# Patient Record
Sex: Female | Born: 1950
Health system: Southern US, Community
[De-identification: ages and names within clinical notes are randomized; demographics above are authoritative.]

## PROBLEM LIST (undated history)

## (undated) DIAGNOSIS — M1990 Unspecified osteoarthritis, unspecified site: Secondary | ICD-10-CM

## (undated) DIAGNOSIS — E785 Hyperlipidemia, unspecified: Secondary | ICD-10-CM

## (undated) DIAGNOSIS — F419 Anxiety disorder, unspecified: Secondary | ICD-10-CM

## (undated) DIAGNOSIS — J189 Pneumonia, unspecified organism: Secondary | ICD-10-CM

## (undated) DIAGNOSIS — N189 Chronic kidney disease, unspecified: Secondary | ICD-10-CM

## (undated) DIAGNOSIS — K7689 Other specified diseases of liver: Secondary | ICD-10-CM

## (undated) DIAGNOSIS — K635 Polyp of colon: Secondary | ICD-10-CM

## (undated) DIAGNOSIS — K5790 Diverticulosis of intestine, part unspecified, without perforation or abscess without bleeding: Secondary | ICD-10-CM

## (undated) DIAGNOSIS — T7840XA Allergy, unspecified, initial encounter: Secondary | ICD-10-CM

## (undated) DIAGNOSIS — I10 Essential (primary) hypertension: Secondary | ICD-10-CM

## (undated) HISTORY — PX: COLONOSCOPY: SHX174

## (undated) HISTORY — DX: Unspecified osteoarthritis, unspecified site: M19.90

## (undated) HISTORY — DX: Other specified diseases of liver: K76.89

## (undated) HISTORY — DX: Anxiety disorder, unspecified: F41.9

## (undated) HISTORY — PX: US ABDOMEN AND PELVIS  (ARMX HX): HXRAD1663

## (undated) HISTORY — PX: BUNIONECTOMY: SHX129

## (undated) HISTORY — DX: Diverticulosis of intestine, part unspecified, without perforation or abscess without bleeding: K57.90

## (undated) HISTORY — PX: HEMORROIDECTOMY: SUR656

## (undated) HISTORY — DX: Polyp of colon: K63.5

## (undated) HISTORY — DX: Hyperlipidemia, unspecified: E78.5

## (undated) HISTORY — DX: Allergy, unspecified, initial encounter: T78.40XA

## (undated) HISTORY — PX: POLYPECTOMY: SHX149

## (undated) HISTORY — DX: Chronic kidney disease, unspecified: N18.9

## (undated) HISTORY — DX: Pneumonia, unspecified organism: J18.9

---

## 2010-10-11 HISTORY — PX: UPPER GASTROINTESTINAL ENDOSCOPY: SHX188

## 2013-10-26 ENCOUNTER — Emergency Department (HOSPITAL_COMMUNITY)
Admission: EM | Admit: 2013-10-26 | Discharge: 2013-10-26 | Disposition: A | Discharge status: Home / Self Care | Payer: BC Managed Care – PPO | Source: Home / Self Care | Attending: Family Medicine | Admitting: Family Medicine

## 2013-10-26 ENCOUNTER — Encounter (HOSPITAL_COMMUNITY): Payer: Self-pay | Admitting: Emergency Medicine

## 2013-10-26 DIAGNOSIS — J069 Acute upper respiratory infection, unspecified: Secondary | ICD-10-CM

## 2013-10-26 DIAGNOSIS — R030 Elevated blood-pressure reading, without diagnosis of hypertension: Secondary | ICD-10-CM

## 2013-10-26 DIAGNOSIS — IMO0001 Reserved for inherently not codable concepts without codable children: Secondary | ICD-10-CM

## 2013-10-26 LAB — POCT RAPID STREP A: Streptococcus, Group A Screen (Direct): NEGATIVE

## 2013-10-26 MED ORDER — IPRATROPIUM BROMIDE 0.06 % NA SOLN: 2.0000 | Freq: Four times a day (QID) | NASAL | Status: DC

## 2013-10-26 NOTE — ED Notes (Signed)
C/o   Chest congestion.  Sinus pressure and pain.  Scratchy throat.  No relief with otc meds.  Symptoms present since Saturday.  Denies fever, n/v/d

## 2013-10-26 NOTE — ED Provider Notes (Signed)
Medical screening examination/treatment/procedure(s) were performed by resident physician or non-physician practitioner and as supervising physician I was immediately available for consultation/collaboration.   Pauline Good MD.   Billy Fischer, MD 10/26/13 [phone]

## 2013-10-26 NOTE — Discharge Instructions (Signed)
Rapid strep screen was negative.   Antibiotic Nonuse  Your caregiver felt that the infection or problem was not one that would be helped with an antibiotic. Infections may be caused by viruses or bacteria. Only a caregiver can tell which one of these is the likely cause of an illness. A cold is the most common cause of infection in both adults and children. A cold is a virus. Antibiotic treatment will have no effect on a viral infection. Viruses can lead to many lost days of work caring for sick children and many missed days of school. Children may catch as many as 10 "colds" or "flus" per year during which they can be tearful, cranky, and uncomfortable. The goal of treating a virus is aimed at keeping the ill person comfortable. Antibiotics are medications used to help the body fight bacterial infections. There are relatively few types of bacteria that cause infections but there are hundreds of viruses. While both viruses and bacteria cause infection they are very different types of germs. A viral infection will typically go away by itself within 7 to 10 days. Bacterial infections may spread or get worse without antibiotic treatment. Examples of bacterial infections are:  Sore throats (like strep throat or tonsillitis).  Infection in the lung (pneumonia).  Ear and skin infections. Examples of viral infections are:  Colds or flus.  Most coughs and bronchitis.  Sore throats not caused by Strep.  Runny noses. It is often best not to take an antibiotic when a viral infection is the cause of the problem. Antibiotics can kill off the helpful bacteria that we have inside our body and allow harmful bacteria to start growing. Antibiotics can cause side effects such as allergies, nausea, and diarrhea without helping to improve the symptoms of the viral infection. Additionally, repeated uses of antibiotics can cause bacteria inside of our body to become resistant. That resistance can be passed onto  harmful bacterial. The next time you have an infection it may be harder to treat if antibiotics are used when they are not needed. Not treating with antibiotics allows our own immune system to develop and take care of infections more efficiently. Also, antibiotics will work better for [place] when they are prescribed for bacterial infections. Treatments for a child that is ill may include:  Give extra fluids throughout the day to stay hydrated.  Get plenty of rest.  Only give your child over-the-counter or prescription medicines for pain, discomfort, or fever as directed by your caregiver.  The use of a cool mist humidifier may help stuffy noses.  Cold medications if suggested by your caregiver. Your caregiver may decide to start you on an antibiotic if:  The problem you were seen for today continues for a longer length of time than expected.  You develop a secondary bacterial infection. SEEK MEDICAL CARE IF:  Fever lasts longer than 5 days.  Symptoms continue to get worse after 5 to 7 days or become severe.  Difficulty in breathing develops.  Signs of dehydration develop (poor drinking, rare urinating, dark colored urine).  Changes in behavior or worsening tiredness (listlessness or lethargy). Document Released: 07/22/2001 Document Revised: 08/05/2011 Document Reviewed: 01/18/2009 [facility] Patient Information 2014 [place], [place].  Hypertension As your heart beats, it forces blood through your arteries. This force is your blood pressure. If the pressure is too high, it is called hypertension (HTN) or high blood pressure. HTN is dangerous because you may have it and not know it. High blood pressure may  mean that your heart has to work harder to pump blood. Your arteries may be narrow or stiff. The extra work puts you at risk for heart disease, stroke, and other problems.  Blood pressure consists of two numbers, a higher number over a lower, 110/72, for example. It is stated as "110 over  72." The ideal is below 120 for the top number (systolic) and under 80 for the bottom (diastolic). Write down your blood pressure today. You should pay close attention to your blood pressure if you have certain conditions such as:  Heart failure.  Prior heart attack.  Diabetes  Chronic kidney disease.  Prior stroke.  Multiple risk factors for heart disease. To see if you have HTN, your blood pressure should be measured while you are seated with your arm held at the level of the heart. It should be measured at least twice. A one-time elevated blood pressure reading (especially in the Emergency Department) does not mean that you need treatment. There may be conditions in which the blood pressure is different between your right and left arms. It is important to see your caregiver soon for a recheck. Most people have essential hypertension which means that there is not a specific cause. This type of high blood pressure may be lowered by changing lifestyle factors such as:  Stress.  Smoking.  Lack of exercise.  Excessive weight.  Drug/tobacco/alcohol use.  Eating less salt. Most people do not have symptoms from high blood pressure until it has caused damage to the body. Effective treatment can often prevent, delay or reduce that damage. TREATMENT  When a cause has been identified, treatment for high blood pressure is directed at the cause. There are a large number of medications to treat HTN. These fall into several categories, and your caregiver will help you select the medicines that are best for you. Medications may have side effects. You should review side effects with your caregiver. If your blood pressure stays high after you have made lifestyle changes or started on medicines,   Your medication(s) may need to be changed.  Other problems may need to be addressed.  Be certain you understand your prescriptions, and know how and when to take your medicine.  Be sure to follow up  with your caregiver within the time frame advised (usually within two weeks) to have your blood pressure rechecked and to review your medications.  If you are taking more than one medicine to lower your blood pressure, make sure you know how and at what times they should be taken. Taking two medicines at the same time can result in blood pressure that is too low. SEEK IMMEDIATE MEDICAL CARE IF:  You develop a severe headache, blurred or changing vision, or confusion.  You have unusual weakness or numbness, or a faint feeling.  You have severe chest or abdominal pain, vomiting, or breathing problems. MAKE SURE YOU:   Understand these instructions.  Will watch your condition.  Will get help right away if you are not doing well or get worse. Document Released: 05/13/2005 Document Revised: 08/05/2011 Document Reviewed: 01/01/2008 [facility] Patient Information 2014 [place].  Upper Respiratory Infection, Adult An upper respiratory infection (URI) is also sometimes known as the common cold. The upper respiratory tract includes the nose, sinuses, throat, trachea, and bronchi. Bronchi are the airways leading to the lungs. Most people improve within 1 week, but symptoms can last up to 2 weeks. A residual cough may last even longer.  CAUSES Many different  viruses can infect the tissues lining the upper respiratory tract. The tissues become irritated and inflamed and often become very moist. Mucus production is also common. A cold is contagious. You can easily spread the virus to others by oral contact. This includes kissing, sharing a glass, coughing, or sneezing. Touching your mouth or nose and then touching a surface, which is then touched by another person, can also spread the virus. SYMPTOMS  Symptoms typically develop 1 to 3 days after you come in contact with a cold virus. Symptoms vary from person to person. They may include:  Runny nose.  Sneezing.  Nasal congestion.  Sinus  irritation.  Sore throat.  Loss of voice (laryngitis).  Cough.  Fatigue.  Muscle aches.  Loss of appetite.  Headache.  Low-grade fever. DIAGNOSIS  You might diagnose your own cold based on familiar symptoms, since most people get a cold 2 to 3 times a year. Your caregiver can confirm this based on your exam. Most importantly, your caregiver can check that your symptoms are not due to another disease such as strep throat, sinusitis, pneumonia, asthma, or epiglottitis. Blood tests, throat tests, and X-rays are not necessary to diagnose a common cold, but they may sometimes be helpful in excluding other more serious diseases. Your caregiver will decide if any further tests are required. RISKS AND COMPLICATIONS  You may be at risk for a more severe case of the common cold if you smoke cigarettes, have chronic heart disease (such as heart failure) or lung disease (such as asthma), or if you have a weakened immune system. The very young and very old are also at risk for more serious infections. Bacterial sinusitis, middle ear infections, and bacterial pneumonia can complicate the common cold. The common cold can worsen asthma and chronic obstructive pulmonary disease (COPD). Sometimes, these complications can require emergency medical care and may be life-threatening. PREVENTION  The best way to protect against getting a cold is to practice good hygiene. Avoid oral or hand contact with people with cold symptoms. Wash your hands often if contact occurs. There is no clear evidence that vitamin C, vitamin E, echinacea, or exercise reduces the chance of developing a cold. However, it is always recommended to get plenty of rest and practice good nutrition. TREATMENT  Treatment is directed at relieving symptoms. There is no cure. Antibiotics are not effective, because the infection is caused by a virus, not by bacteria. Treatment may include:  Increased fluid intake. Sports drinks offer valuable  electrolytes, sugars, and fluids.  Breathing heated mist or steam (vaporizer or shower).  Eating chicken soup or other clear broths, and maintaining good nutrition.  Getting plenty of rest.  Using gargles or lozenges for comfort.  Controlling fevers with ibuprofen or acetaminophen as directed by your caregiver.  Increasing usage of your inhaler if you have asthma. Zinc gel and zinc lozenges, taken in the first 24 hours of the common cold, can shorten the duration and lessen the severity of symptoms. Pain medicines may help with fever, muscle aches, and throat pain. A variety of non-prescription medicines are available to treat congestion and runny nose. Your caregiver can make recommendations and may suggest nasal or lung inhalers for other symptoms.  HOME CARE INSTRUCTIONS   Only take over-the-counter or prescription medicines for pain, discomfort, or fever as directed by your caregiver.  Use a warm mist humidifier or inhale steam from a shower to increase air moisture. This may keep secretions moist and make it easier to  breathe.  Drink enough water and fluids to keep your urine clear or pale yellow.  Rest as needed.  Return to work when your temperature has returned to normal or as your caregiver advises. You may need to stay home longer to avoid infecting others. You can also use a face mask and careful hand washing to prevent spread of the virus. SEEK MEDICAL CARE IF:   After the first few days, you feel you are getting worse rather than better.  You need your caregiver's advice about medicines to control symptoms.  You develop chills, worsening shortness of breath, or brown or red sputum. These may be signs of pneumonia.  You develop yellow or brown nasal discharge or pain in the face, especially when you bend forward. These may be signs of sinusitis.  You develop a fever, swollen neck glands, pain with swallowing, or white areas in the back of your throat. These may be signs  of strep throat. SEEK IMMEDIATE MEDICAL CARE IF:   You have a fever.  You develop severe or persistent headache, ear pain, sinus pain, or chest pain.  You develop wheezing, a prolonged cough, cough up blood, or have a change in your usual mucus (if you have chronic lung disease).  You develop sore muscles or a stiff neck. Document Released: 11/06/2000 Document Revised: 08/05/2011 Document Reviewed: 09/14/2010 [facility] Patient Information 2014 [place], [place].

## 2013-10-26 NOTE — ED Provider Notes (Signed)
CSN: [id number]     Arrival date & time 10/26/13  1141 History   First MD Initiated Contact with Patient 10/26/13 1331     Chief Complaint  Patient presents with  . URI   (Consider location/radiation/quality/duration/timing/severity/associated sxs/prior Treatment) HPI Comments: Non-smoker Retired PCP: none  Patient is a 63 y.o. female presenting with URI. The history is provided by the patient.  URI Presenting symptoms: congestion, cough, rhinorrhea and sore throat   Presenting symptoms: no fatigue and no fever   Severity:  Mild Onset quality:  Gradual Duration:  3 days Timing:  Constant Progression:  Unchanged Chronicity:  New Associated symptoms: no arthralgias, no headaches, no myalgias, no neck pain, no sinus pain, no sneezing, no swollen glands and no wheezing   Risk factors: sick contacts   Risk factors comment:  +husband ill with same   History reviewed. No pertinent past medical history. History reviewed. No pertinent past surgical history. History reviewed. No pertinent family history. History  Substance Use Topics  . Smoking status: Never Smoker   . Smokeless tobacco: Not on file  . Alcohol Use: Yes   OB History   Grav Para Term Preterm Abortions TAB SAB Ect Mult Living                 Review of Systems  Constitutional: Negative for fever, chills and fatigue.  HENT: Positive for congestion, rhinorrhea, sinus pressure and sore throat. Negative for nosebleeds, postnasal drip and sneezing.   Eyes: Negative.   Respiratory: Positive for cough. Negative for chest tightness and wheezing.   Cardiovascular: Negative.   Genitourinary: Negative.   Musculoskeletal: Negative for arthralgias, myalgias and neck pain.  Skin: Negative.   Neurological: Negative for dizziness, weakness, light-headedness and headaches.    Allergies  Review of patient's allergies indicates no known allergies.  Home Medications   Prior to Admission medications   Medication Sig Start Date  End Date Taking? Authorizing Provider  simvastatin (ZOCOR) 20 MG tablet Take 20 mg by mouth daily.   Yes Historical Provider, MD  ipratropium (ATROVENT) 0.06 % nasal spray Place 2 sprays into both nostrils 4 (four) times daily. 10/26/13   Annett Gula Mckinlee Dunk, PA   BP 159/89  Pulse 75  Temp(Src) 98.3 F (36.8 C) (Oral)  Resp 20  SpO2 97% Physical Exam  Nursing note and vitals reviewed. Constitutional: She is oriented to person, place, and time. She appears well-developed and well-nourished. No distress.  HENT:  Head: Normocephalic and atraumatic.  Right Ear: Hearing, tympanic membrane, external ear and ear canal normal.  Left Ear: Hearing, tympanic membrane, external ear and ear canal normal.  Nose: Nose normal.  Mouth/Throat: Uvula is midline, oropharynx is clear and moist and mucous membranes are normal. No oral lesions. No trismus in the jaw.  Eyes: Conjunctivae are normal. Right eye exhibits no discharge. Left eye exhibits no discharge. No scleral icterus.  Neck: Normal range of motion. Neck supple.  Cardiovascular: Normal rate, regular rhythm and normal heart sounds.   Pulmonary/Chest: Effort normal and breath sounds normal. No respiratory distress. She has no wheezes.  Musculoskeletal: Normal range of motion.  Lymphadenopathy:    She has no cervical adenopathy.  Neurological: She is alert and oriented to person, place, and time.  Skin: Skin is warm and dry. No rash noted. No erythema.  Psychiatric: She has a normal mood and affect. Her behavior is normal.    ED Course  Procedures (including critical care time) Labs Review Labs Reviewed  POCT RAPID  STREP A ([facility]RG CARE ONLY)    Imaging Review No results found.   MDM   1. URI (upper respiratory infection)   2. Elevated blood pressure    Rapid strep negative. Advised patient regarding symptomatic care at home and that illness if likely a viral URI and antibiotics not clinically indicated. Will provide Rx for Atrovent  nasal spray and advise patient locate a primary care provider to follow up with for elevated BP. Discourage use of oral decongestants given BP elevation.    [place], [place] 10/26/13 1438

## 2014-01-19 DIAGNOSIS — Z1231 Encounter for screening mammogram for malignant neoplasm of breast: Secondary | ICD-10-CM | POA: Diagnosis not present

## 2014-01-19 DIAGNOSIS — Z1289 Encounter for screening for malignant neoplasm of other sites: Secondary | ICD-10-CM | POA: Diagnosis not present

## 2014-01-24 DIAGNOSIS — Z87891 Personal history of nicotine dependence: Secondary | ICD-10-CM | POA: Diagnosis not present

## 2014-01-24 DIAGNOSIS — Z6828 Body mass index (BMI) 28.0-28.9, adult: Secondary | ICD-10-CM | POA: Diagnosis not present

## 2014-01-24 DIAGNOSIS — Z1331 Encounter for screening for depression: Secondary | ICD-10-CM | POA: Diagnosis not present

## 2014-01-24 DIAGNOSIS — E785 Hyperlipidemia, unspecified: Secondary | ICD-10-CM | POA: Diagnosis not present

## 2014-01-24 DIAGNOSIS — J189 Pneumonia, unspecified organism: Secondary | ICD-10-CM | POA: Diagnosis not present

## 2014-01-24 DIAGNOSIS — K5900 Constipation, unspecified: Secondary | ICD-10-CM | POA: Diagnosis not present

## 2014-03-01 DIAGNOSIS — Z79899 Other long term (current) drug therapy: Secondary | ICD-10-CM | POA: Diagnosis not present

## 2014-03-01 DIAGNOSIS — E785 Hyperlipidemia, unspecified: Secondary | ICD-10-CM | POA: Diagnosis not present

## 2014-03-07 DIAGNOSIS — R1013 Epigastric pain: Secondary | ICD-10-CM | POA: Diagnosis not present

## 2014-03-07 DIAGNOSIS — Z87891 Personal history of nicotine dependence: Secondary | ICD-10-CM | POA: Diagnosis not present

## 2014-03-07 DIAGNOSIS — Z6829 Body mass index (BMI) 29.0-29.9, adult: Secondary | ICD-10-CM | POA: Diagnosis not present

## 2014-03-07 DIAGNOSIS — Z0000 Encounter for general adult medical examination without abnormal findings: Secondary | ICD-10-CM | POA: Diagnosis not present

## 2014-03-07 DIAGNOSIS — Z23 Encounter for immunization: Secondary | ICD-10-CM | POA: Diagnosis not present

## 2014-03-07 DIAGNOSIS — E785 Hyperlipidemia, unspecified: Secondary | ICD-10-CM | POA: Diagnosis not present

## 2014-03-07 DIAGNOSIS — R7309 Other abnormal glucose: Secondary | ICD-10-CM | POA: Diagnosis not present

## 2014-03-08 DIAGNOSIS — Z1212 Encounter for screening for malignant neoplasm of rectum: Secondary | ICD-10-CM | POA: Diagnosis not present

## 2014-03-25 DIAGNOSIS — H5203 Hypermetropia, bilateral: Secondary | ICD-10-CM | POA: Diagnosis not present

## 2015-02-28 DIAGNOSIS — E785 Hyperlipidemia, unspecified: Secondary | ICD-10-CM | POA: Diagnosis not present

## 2015-02-28 DIAGNOSIS — R7309 Other abnormal glucose: Secondary | ICD-10-CM | POA: Diagnosis not present

## 2015-03-10 DIAGNOSIS — L659 Nonscarring hair loss, unspecified: Secondary | ICD-10-CM | POA: Diagnosis not present

## 2015-03-10 DIAGNOSIS — R7309 Other abnormal glucose: Secondary | ICD-10-CM | POA: Diagnosis not present

## 2015-03-10 DIAGNOSIS — Z6829 Body mass index (BMI) 29.0-29.9, adult: Secondary | ICD-10-CM | POA: Diagnosis not present

## 2015-03-10 DIAGNOSIS — R1013 Epigastric pain: Secondary | ICD-10-CM | POA: Diagnosis not present

## 2015-03-10 DIAGNOSIS — Z0000 Encounter for general adult medical examination without abnormal findings: Secondary | ICD-10-CM | POA: Diagnosis not present

## 2015-03-10 DIAGNOSIS — E785 Hyperlipidemia, unspecified: Secondary | ICD-10-CM | POA: Diagnosis not present

## 2015-03-10 DIAGNOSIS — D229 Melanocytic nevi, unspecified: Secondary | ICD-10-CM | POA: Diagnosis not present

## 2015-03-10 DIAGNOSIS — N183 Chronic kidney disease, stage 3 (moderate): Secondary | ICD-10-CM | POA: Diagnosis not present

## 2015-03-10 DIAGNOSIS — Z23 Encounter for immunization: Secondary | ICD-10-CM | POA: Diagnosis not present

## 2015-03-28 DIAGNOSIS — H04123 Dry eye syndrome of bilateral lacrimal glands: Secondary | ICD-10-CM | POA: Diagnosis not present

## 2015-03-28 DIAGNOSIS — H2513 Age-related nuclear cataract, bilateral: Secondary | ICD-10-CM | POA: Diagnosis not present

## 2015-03-28 DIAGNOSIS — Z961 Presence of intraocular lens: Secondary | ICD-10-CM | POA: Diagnosis not present

## 2015-03-30 DIAGNOSIS — Z1212 Encounter for screening for malignant neoplasm of rectum: Secondary | ICD-10-CM | POA: Diagnosis not present

## 2015-04-10 DIAGNOSIS — R1013 Epigastric pain: Secondary | ICD-10-CM | POA: Diagnosis not present

## 2015-04-10 DIAGNOSIS — M791 Myalgia: Secondary | ICD-10-CM | POA: Diagnosis not present

## 2015-04-10 DIAGNOSIS — R140 Abdominal distension (gaseous): Secondary | ICD-10-CM | POA: Diagnosis not present

## 2015-04-10 DIAGNOSIS — Z6829 Body mass index (BMI) 29.0-29.9, adult: Secondary | ICD-10-CM | POA: Diagnosis not present

## 2015-04-13 DIAGNOSIS — D2271 Melanocytic nevi of right lower limb, including hip: Secondary | ICD-10-CM | POA: Diagnosis not present

## 2015-04-13 DIAGNOSIS — L821 Other seborrheic keratosis: Secondary | ICD-10-CM | POA: Diagnosis not present

## 2015-04-13 DIAGNOSIS — L649 Androgenic alopecia, unspecified: Secondary | ICD-10-CM | POA: Diagnosis not present

## 2015-04-13 DIAGNOSIS — L814 Other melanin hyperpigmentation: Secondary | ICD-10-CM | POA: Diagnosis not present

## 2015-05-03 DIAGNOSIS — M791 Myalgia: Secondary | ICD-10-CM | POA: Diagnosis not present

## 2015-05-03 DIAGNOSIS — M2550 Pain in unspecified joint: Secondary | ICD-10-CM | POA: Diagnosis not present

## 2015-05-03 DIAGNOSIS — E784 Other hyperlipidemia: Secondary | ICD-10-CM | POA: Diagnosis not present

## 2015-05-03 DIAGNOSIS — Z6828 Body mass index (BMI) 28.0-28.9, adult: Secondary | ICD-10-CM | POA: Diagnosis not present

## 2015-05-03 DIAGNOSIS — N183 Chronic kidney disease, stage 3 (moderate): Secondary | ICD-10-CM | POA: Diagnosis not present

## 2015-05-03 DIAGNOSIS — R748 Abnormal levels of other serum enzymes: Secondary | ICD-10-CM | POA: Diagnosis not present

## 2015-06-26 DIAGNOSIS — Z6829 Body mass index (BMI) 29.0-29.9, adult: Secondary | ICD-10-CM | POA: Diagnosis not present

## 2015-06-26 DIAGNOSIS — R05 Cough: Secondary | ICD-10-CM | POA: Diagnosis not present

## 2015-06-26 DIAGNOSIS — J209 Acute bronchitis, unspecified: Secondary | ICD-10-CM | POA: Diagnosis not present

## 2015-07-20 DIAGNOSIS — R490 Dysphonia: Secondary | ICD-10-CM | POA: Diagnosis not present

## 2015-07-20 DIAGNOSIS — R197 Diarrhea, unspecified: Secondary | ICD-10-CM | POA: Diagnosis not present

## 2015-07-20 DIAGNOSIS — Z6828 Body mass index (BMI) 28.0-28.9, adult: Secondary | ICD-10-CM | POA: Diagnosis not present

## 2015-07-20 DIAGNOSIS — K219 Gastro-esophageal reflux disease without esophagitis: Secondary | ICD-10-CM | POA: Diagnosis not present

## 2015-07-20 DIAGNOSIS — H01133 Eczematous dermatitis of right eye, unspecified eyelid: Secondary | ICD-10-CM | POA: Diagnosis not present

## 2015-07-21 ENCOUNTER — Encounter: Payer: Self-pay | Admitting: Physician Assistant

## 2015-07-31 ENCOUNTER — Ambulatory Visit: Payer: Self-pay | Admitting: Physician Assistant

## 2015-08-26 DIAGNOSIS — K7689 Other specified diseases of liver: Secondary | ICD-10-CM

## 2015-08-26 HISTORY — DX: Other specified diseases of liver: K76.89

## 2015-09-12 ENCOUNTER — Ambulatory Visit (INDEPENDENT_AMBULATORY_CARE_PROVIDER_SITE_OTHER): Payer: Medicare Other | Admitting: Internal Medicine

## 2015-09-12 ENCOUNTER — Encounter: Payer: Self-pay | Admitting: Internal Medicine

## 2015-09-12 VITALS — BP 152/92 | HR 76 | Ht 63.25 in | Wt 167.2 lb

## 2015-09-12 DIAGNOSIS — Z86010 Personal history of colonic polyps: Secondary | ICD-10-CM | POA: Diagnosis not present

## 2015-09-12 DIAGNOSIS — R1013 Epigastric pain: Secondary | ICD-10-CM

## 2015-09-12 NOTE — Patient Instructions (Signed)
You have been scheduled for an abdominal ultrasound at [facility] (1st floor of hospital) on 09/15/2015 at 9:00am. Please arrive 15 minutes prior to your appointment for registration. Make certain not to have anything to eat or drink 6 hours prior to your appointment. Should you need to reschedule your appointment, please contact radiology at [phone]. This test typically takes about 30 minutes to perform.  You have been scheduled for an endoscopy. Please follow written instructions given to you at your visit today. If you use inhalers (even only as needed), please bring them with you on the day of your procedure. Your physician has requested that you go to www.startemmi.com and enter the access code given to you at your visit today. This web site gives a general overview about your procedure. However, you should still follow specific instructions given to you by our office regarding your preparation for the procedure.

## 2015-09-12 NOTE — Progress Notes (Signed)
HISTORY OF PRESENT ILLNESS:  Madison [name] is a 65 y.o. female with a history of anxiety and arthritis who is referred for consultation by Dr. Ardeth Perfect with chief complaint of multiple GI complaints. The patient is new to this office. Previous GI history in [place]. Limited records. Patient tells me that she has had long-standing "stomach problems". Apparently successfully treated for Helicobacter pylori in 2012. Multiple current complaints include epigastric discomfort with radiation under the left breast and chest. She has this intermittently. Her description is quite vague. She questions stress as a cause. She is not specific regarding duration or frequency but thinks that it occurs about once per week and lasts for hours. She denies that meals, activity, or body position have any effect. Symptoms seem to come principally at night when lying. Had been placed on Dexilant for about 2 months which she states helped. However discontinued the medication due to diarrhea. She takes NSAIDs infrequently for arthritis. She denies weight loss or esophageal dysphagia. She does complain of belching. She reports her bowel habits as chronically constipated for which she takes daily magnesium supplement, which else. No bleeding. Review of outside records from [place] January 2012 shows upper endoscopy which was normal except for a duodenal polyp which was removed. No pathology available for review. No cause for her abdominal pain was found. She was given a trial of Librax (not certain if this helped). There is also post procedure instruction page dated 11/01/2011 which states that the patient had 3 polyps and diverticulosis for which colonoscopy in 3 years was recommended. No pathology report. Records from her referring physician show negative stool for Clostridium difficile in February. Normal magnesium and CBC. Normal liver function tests except for ALT of 46 (45 upper limit of normal).  REVIEW OF SYSTEMS:  All non-GI  ROS negative except for anxiety, arthritis  Past Medical History  Diagnosis Date  . Anxiety   . Arthritis   . Diverticulosis   . HLD (hyperlipidemia)   . Pneumonia   . Colon polyps     Past Surgical History  Procedure Laterality Date  . Hemorroidectomy    . Bunionectomy Right     Social History Madison [name]  reports that she quit smoking about 20 years ago. Her smoking use included Cigarettes. She has never used smokeless tobacco. She reports that she drinks about 8.4 oz of alcohol per week. She reports that she does not use illicit drugs.  family history includes Alzheimer's disease in her sister; Lung cancer in her father; Other in her father.  Allergies  Allergen Reactions  . Dexilant [Dexlansoprazole] Diarrhea       PHYSICAL EXAMINATION: Vital signs: BP 152/92 mmHg  Pulse 76  Ht 5' 3.25" (1.607 m)  Wt 167 lb 4 oz (75.864 kg)  BMI 29.38 kg/m2  Constitutional: generally well-appearing, no acute distress Psychiatric: alert and oriented x3, cooperative Eyes: extraocular movements intact, anicteric, conjunctiva pink Mouth: oral pharynx moist, no lesions Neck: supple no lymphadenopathy Cardiovascular: heart regular rate and rhythm, no murmur Lungs: clear to auscultation bilaterally Abdomen: soft,Obese, mild epigastric tenderness to palpation, nondistended, no obvious ascites, no peritoneal signs, normal bowel sounds, no organomegaly Rectal:Omitted Extremities: no clubbing cyanosis or lower extremity edema bilaterally Skin: no lesions on visible extremities Neuro: No focal deficits. Slightly diminished DTRs. Cranial nerves intact No asterixis.    ASSESSMENT:  #1. Vague epigastric discomfort. Suspect functional dyspepsia. Question GERD equivalent #2. History of Helicobacter pylori. Cleared up with medical therapy per patient after follow-up  breath test #3. History of chronic constipation #4. Previous EGD with duodenal polyp. January 2012. No pathology #5. Previous  colonoscopy with 3 polyps and diverticulosis June 2013. No pathology   PLAN:  #1. EGD to evaluate epigastric discomfort.The nature of the procedure, as well as the risks, benefits, and alternatives were carefully and thoroughly reviewed with the patient. Ample time for discussion and questions allowed. The patient understood, was satisfied, and agreed to proceed. #2. Abdominal ultrasound to evaluate epigastric discomfort #3. Obtain outside pathology from duodenal polyp and colonic polyps (if possible) #4. Obtain outside colonoscopy report for review (if possible). Can hopefully determine proper follow-up interval for surveillance  A copy of this consultation note has been sent to Dr. Ardeth Perfect

## 2015-09-15 ENCOUNTER — Ambulatory Visit (HOSPITAL_COMMUNITY)
Admission: RE | Admit: 2015-09-15 | Discharge: 2015-09-15 | Disposition: A | Discharge status: Home / Self Care | Payer: Medicare Other | Source: Ambulatory Visit | Attending: Internal Medicine | Admitting: Internal Medicine

## 2015-09-15 DIAGNOSIS — Q446 Cystic disease of liver: Secondary | ICD-10-CM | POA: Insufficient documentation

## 2015-09-15 DIAGNOSIS — K7689 Other specified diseases of liver: Secondary | ICD-10-CM | POA: Diagnosis not present

## 2015-09-15 DIAGNOSIS — R1013 Epigastric pain: Secondary | ICD-10-CM | POA: Insufficient documentation

## 2015-10-03 ENCOUNTER — Ambulatory Visit (AMBULATORY_SURGERY_CENTER): Payer: Medicare Other | Admitting: Internal Medicine

## 2015-10-03 ENCOUNTER — Telehealth: Payer: Self-pay | Admitting: *Deleted

## 2015-10-03 ENCOUNTER — Encounter: Payer: Self-pay | Admitting: Internal Medicine

## 2015-10-03 VITALS — BP 138/81 | HR 60 | Temp 98.6°F | Resp 13 | Ht 63.0 in | Wt 167.0 lb

## 2015-10-03 DIAGNOSIS — K2100 Gastro-esophageal reflux disease with esophagitis, without bleeding: Secondary | ICD-10-CM

## 2015-10-03 DIAGNOSIS — R1013 Epigastric pain: Secondary | ICD-10-CM | POA: Diagnosis present

## 2015-10-03 DIAGNOSIS — F419 Anxiety disorder, unspecified: Secondary | ICD-10-CM | POA: Diagnosis not present

## 2015-10-03 MED ORDER — OMEPRAZOLE 20 MG PO CPDR: 20.0000 mg | DELAYED_RELEASE_CAPSULE | Freq: Every day | ORAL | Status: DC

## 2015-10-03 MED ORDER — SODIUM CHLORIDE 0.9 % IV SOLN: 500.0000 mL | INTRAVENOUS | Status: DC

## 2015-10-03 NOTE — Progress Notes (Signed)
Pt needs colonoscopy scheduled since she was due to have one in 2016. Pt wanted to go home look at her calendar before scheduling . Phone note sent by Sundra Aland to Nelda Severe to call pt later this afternoon to schedule this for pt.

## 2015-10-03 NOTE — Op Note (Signed)
[place] Patient Name: Madison [name] Procedure Date: 10/03/2015 9:59 AM MRN: [id number] Endoscopist: Docia Chuck. Henrene Pastor , MD Age: 65 Date of Birth: 05/01/1951 Gender: Female Procedure:                Upper GI endoscopy Indications:              Epigastric abdominal pain Medicines:                Monitored Anesthesia Care Procedure:                Pre-Anesthesia Assessment:                           - Prior to the procedure, a History and Physical                            was performed, and patient medications and                            allergies were reviewed. The patient's tolerance of                            previous anesthesia was also reviewed. The risks                            and benefits of the procedure and the sedation                            options and risks were discussed with the patient.                            All questions were answered, and informed consent                            was obtained. Prior Anticoagulants: The patient has                            taken no previous anticoagulant or antiplatelet                            agents. ASA Grade Assessment: II - A patient with                            mild systemic disease. After reviewing the risks                            and benefits, the patient was deemed in                            satisfactory condition to undergo the procedure.                           After obtaining informed consent, the endoscope was  passed under direct vision. Throughout the                            procedure, the patient's blood pressure, pulse, and                            oxygen saturations were monitored continuously. The                            Model GIF-HQ190 [id number]) scope was introduced                            through the mouth, and advanced to the second part                            of duodenum. The upper GI endoscopy was   accomplished without difficulty. The patient                            tolerated the procedure well. Scope In: Scope Out: Findings:                 Mild Reflux esophagitis was found at the                            gastroesophageal junction.                           The exam of the esophagus was otherwise normal.                           The stomach was normal.                           The examined duodenum was normal.                           The cardia and gastric fundus were normal on                            retroflexion. Complications:            No immediate complications. Estimated Blood Loss:     Estimated blood loss: none. Impression:               - Non-severe reflux esophagitis.                           - Otherwise normal EGD. Recommendation:           1. Reflux precautions                           2. Prescribe omeprazole 20 mg daily; #30; 6 refills                           3. Schedule surveillance colonoscopy in the [place].  History of colon polyps in 2013 elsewhere. Told                            follow-up in 3 years. Docia Chuck. Henrene Pastor, MD 10/03/2015 10:21:40 AM This report has been signed electronically. CC Letter to:             Velna Hatchet, M.D.

## 2015-10-03 NOTE — Patient Instructions (Signed)
YOU HAD AN ENDOSCOPIC PROCEDURE TODAY AT [facility]:   Refer to the procedure report that was given to you for any specific questions about what was found during the examination.  If the procedure report does not answer your questions, please call your gastroenterologist to clarify.  If you requested that your care partner not be given the details of your procedure findings, then the procedure report has been included in a sealed envelope for you to review at your convenience later.  YOU SHOULD EXPECT: Some feelings of bloating in the abdomen. Passage of more gas than usual.  Walking can help get rid of the air that was put into your GI tract during the procedure and reduce the bloating. If you had a lower endoscopy (such as a colonoscopy or flexible sigmoidoscopy) you may notice spotting of blood in your stool or on the toilet paper. If you underwent a bowel prep for your procedure, you may not have a normal bowel movement for a few days.  Please Note:  You might notice some irritation and congestion in your nose or some drainage.  This is from the oxygen used during your procedure.  There is no need for concern and it should clear up in a day or so.  SYMPTOMS TO REPORT IMMEDIATELY:     Following upper endoscopy (EGD)  Vomiting of blood or coffee ground material  New chest pain or pain under the shoulder blades  Painful or persistently difficult swallowing  New shortness of breath  Fever of 100F or higher  Black, tarry-looking stools  For urgent or emergent issues, a gastroenterologist can be reached at any hour by calling [phone].   DIET: Your first meal following the procedure should be a small meal and then it is ok to progress to your normal diet. Heavy or fried foods are harder to digest and may make you feel nauseous or bloated.  Likewise, meals heavy in dairy and vegetables can increase bloating.  Drink plenty of fluids but you should avoid alcoholic beverages  for 24 hours.  ACTIVITY:  You should plan to take it easy for the rest of today and you should NOT DRIVE or use heavy machinery until tomorrow (because of the sedation medicines used during the test).    FOLLOW UP: Our staff will call the number listed on your records the next business day following your procedure to check on you and address any questions or concerns that you may have regarding the information given to you following your procedure. If we do not reach you, we will leave a message.  However, if you are feeling well and you are not experiencing any problems, there is no need to return our call.  We will assume that you have returned to your regular daily activities without incident.  If any biopsies were taken you will be contacted by phone or by letter within the next 1-3 weeks.  Please call us at [phone] if you have not heard about the biopsies in 3 weeks.    SIGNATURES/CONFIDENTIALITY: You and/or your care partner have signed paperwork which will be entered into your electronic medical record.  These signatures attest to the fact that that the information above on your After Visit Summary has been reviewed and is understood.  Full responsibility of the confidentiality of this discharge information lies with you and/or your care-partner.    Omeprazole 20 mg daily ( 30 minutes before 1st meal of the day) -this medication order  has been sent to [facility] for you.  Anti reflux measures information given to you today

## 2015-10-03 NOTE — Progress Notes (Signed)
To pacu vss patent aw reprot to rn 

## 2015-10-03 NOTE — Telephone Encounter (Signed)
Patient had EGD today. She needs colonoscopy appointment made per Dr.Perry. Patient did not want to make this during recovery "needs to get her calender". Will you please call her this afternoon to make the colon appointment and pre-visit. She is aware we will be calling her. Thank you for your help with this. Muhamed Luecke

## 2015-10-04 ENCOUNTER — Encounter: Payer: Self-pay | Admitting: Internal Medicine

## 2015-10-04 ENCOUNTER — Telehealth: Payer: Self-pay | Admitting: *Deleted

## 2015-10-04 NOTE — Telephone Encounter (Signed)
  Follow up Call-  Call back number 10/03/2015  Post procedure Call Back phone  # [phone]  Permission to leave phone message Yes    [facility]

## 2015-10-04 NOTE — Telephone Encounter (Signed)
Spoke to pt and scheduled Colon for 12-05-15./yf

## 2015-11-21 ENCOUNTER — Ambulatory Visit (AMBULATORY_SURGERY_CENTER): Payer: Self-pay

## 2015-11-21 VITALS — Ht 64.0 in | Wt 166.2 lb

## 2015-11-21 DIAGNOSIS — Z86010 Personal history of colonic polyps: Secondary | ICD-10-CM

## 2015-11-21 MED ORDER — NA SULFATE-K SULFATE-MG SULF 17.5-3.13-1.6 GM/177ML PO SOLN: ORAL | Status: DC

## 2015-11-21 NOTE — Progress Notes (Signed)
Per pt, no allergies to soy or egg products.Pt not taking any weight loss meds or using  O2 at home. 

## 2015-12-05 ENCOUNTER — Encounter: Payer: Self-pay | Admitting: Internal Medicine

## 2015-12-05 ENCOUNTER — Ambulatory Visit (AMBULATORY_SURGERY_CENTER): Payer: Medicare Other | Admitting: Internal Medicine

## 2015-12-05 VITALS — BP 143/83 | HR 60 | Temp 98.4°F | Resp 18 | Ht 64.0 in | Wt 166.0 lb

## 2015-12-05 DIAGNOSIS — Z86010 Personal history of colonic polyps: Secondary | ICD-10-CM

## 2015-12-05 DIAGNOSIS — Z1211 Encounter for screening for malignant neoplasm of colon: Secondary | ICD-10-CM | POA: Diagnosis not present

## 2015-12-05 MED ORDER — SODIUM CHLORIDE 0.9 % IV SOLN: 500.0000 mL | INTRAVENOUS | Status: DC

## 2015-12-05 NOTE — Patient Instructions (Signed)

## 2015-12-05 NOTE — Progress Notes (Signed)
A and O x3. Report to RN. Tolerated MAC anesthesia well. 

## 2015-12-05 NOTE — Op Note (Signed)
[place] Patient Name: Madison [name] Procedure Date: 12/05/2015 9:02 AM MRN: [id number] Endoscopist: Docia Chuck. Henrene Pastor , MD Age: 65 Referring MD:  Date of Birth: 1951-05-25 Gender: Female Account #: [id number] Procedure:                Colonoscopy Indications:              High risk colon cancer surveillance: Personal                            history of colonic polyps. Had colon[place] in [place] June 2013. Had 3 polyps and was told to                            return in 3 years. No pathology available for                            review. Polyps presumed to be adenomatous and or                            advanced given follow-up interval recommendation. Medicines:                Monitored Anesthesia Care Procedure:                Pre-Anesthesia Assessment:                           - Prior to the procedure, a History and Physical                            was performed, and patient medications and                            allergies were reviewed. The patient's tolerance of                            previous anesthesia was also reviewed. The risks                            and benefits of the procedure and the sedation                            options and risks were discussed with the patient.                            All questions were answered, and informed consent                            was obtained. Prior Anticoagulants: The patient has                            taken no previous anticoagulant or antiplatelet  agents. ASA Grade Assessment: II - A patient with                            mild systemic disease. After reviewing the risks                            and benefits, the patient was deemed in                            satisfactory condition to undergo the procedure.                           After obtaining informed consent, the colonoscope                            was passed under direct  vision. Throughout the                            procedure, the patient's blood pressure, pulse, and                            oxygen saturations were monitored continuously. The                            Model CF-HQ190L [id number]) scope was introduced                            through the anus and advanced to the the cecum,                            identified by appendiceal orifice and ileocecal                            valve. The ileocecal valve, appendiceal orifice,                            and rectum were photographed. The quality of the                            bowel preparation was excellent. The colonoscopy                            was performed without difficulty. The patient                            tolerated the procedure well. The bowel preparation                            used was SUPREP. Scope In: 9:13:06 AM Scope Out: 9:22:10 AM Scope Withdrawal Time: 0 hours 6 minutes 44 seconds  Total Procedure Duration: 0 hours 9 minutes 4 seconds  Findings:                 Multiple small and large-mouthed diverticula were  found in the entire colon.                           Internal hemorrhoids were found during retroflexion.                           The exam was otherwise without abnormality on                            direct and retroflexion views. Complications:            No immediate complications. Estimated blood loss:                            None. Estimated Blood Loss:     Estimated blood loss: none. Impression:               - Diverticulosis in the entire examined colon.                           - Internal hemorrhoids.                           - The examination was otherwise normal on direct                            and retroflexion views.                           - No specimens collected. Recommendation:           - Repeat colonoscopy in 5 years for surveillance.                           - Patient has a contact number  available for                            emergencies. The signs and symptoms of potential                            delayed complications were discussed with the                            patient. Return to normal activities tomorrow.                            Written discharge instructions were provided to the                            patient.                           - Resume previous diet.                           - Continue present medications. Docia Chuck. Henrene Pastor, MD 12/05/2015 9:27:27 AM This report has been signed electronically.

## 2015-12-06 ENCOUNTER — Telehealth: Payer: Self-pay | Admitting: *Deleted

## 2015-12-06 NOTE — Telephone Encounter (Signed)
  Follow up Call-  Call back number 12/05/2015 10/03/2015  Post procedure Call Back phone  # [phone]  Permission to leave phone message Yes Yes     Patient questions:  Do you have a fever, pain , or abdominal swelling? No. Pain Score  0 *  Have you tolerated food without any problems? Yes.    Have you been able to return to your normal activities? Yes.    Do you have any questions about your discharge instructions: Diet   No. Medications  No. Follow up visit  No.  Do you have questions or concerns about your Care? No.  Actions: * If pain score is 4 or above: No action needed, pain <4.

## 2016-02-07 DIAGNOSIS — Z124 Encounter for screening for malignant neoplasm of cervix: Secondary | ICD-10-CM | POA: Diagnosis not present

## 2016-02-07 DIAGNOSIS — Z1231 Encounter for screening mammogram for malignant neoplasm of breast: Secondary | ICD-10-CM | POA: Diagnosis not present

## 2016-03-08 DIAGNOSIS — R7309 Other abnormal glucose: Secondary | ICD-10-CM | POA: Diagnosis not present

## 2016-03-08 DIAGNOSIS — E784 Other hyperlipidemia: Secondary | ICD-10-CM | POA: Diagnosis not present

## 2016-03-15 DIAGNOSIS — E784 Other hyperlipidemia: Secondary | ICD-10-CM | POA: Diagnosis not present

## 2016-03-15 DIAGNOSIS — Z0000 Encounter for general adult medical examination without abnormal findings: Secondary | ICD-10-CM | POA: Diagnosis not present

## 2016-03-15 DIAGNOSIS — Z1389 Encounter for screening for other disorder: Secondary | ICD-10-CM | POA: Diagnosis not present

## 2016-03-15 DIAGNOSIS — Z6828 Body mass index (BMI) 28.0-28.9, adult: Secondary | ICD-10-CM | POA: Diagnosis not present

## 2016-03-15 DIAGNOSIS — Z23 Encounter for immunization: Secondary | ICD-10-CM | POA: Diagnosis not present

## 2016-03-15 DIAGNOSIS — J019 Acute sinusitis, unspecified: Secondary | ICD-10-CM | POA: Diagnosis not present

## 2016-03-15 DIAGNOSIS — Z1212 Encounter for screening for malignant neoplasm of rectum: Secondary | ICD-10-CM | POA: Diagnosis not present

## 2016-03-15 DIAGNOSIS — K769 Liver disease, unspecified: Secondary | ICD-10-CM | POA: Diagnosis not present

## 2016-04-01 DIAGNOSIS — Z0100 Encounter for examination of eyes and vision without abnormal findings: Secondary | ICD-10-CM | POA: Diagnosis not present

## 2016-04-01 DIAGNOSIS — H2513 Age-related nuclear cataract, bilateral: Secondary | ICD-10-CM | POA: Diagnosis not present

## 2016-09-17 ENCOUNTER — Telehealth: Payer: Self-pay

## 2016-09-17 ENCOUNTER — Other Ambulatory Visit: Payer: Self-pay

## 2016-09-17 DIAGNOSIS — K7689 Other specified diseases of liver: Secondary | ICD-10-CM

## 2016-09-17 NOTE — Telephone Encounter (Signed)
-----   Message from [name], RN sent at 09/18/2015 10:02 AM EDT ----- Needs [place] of liver- see results 09/15/15 - Madison [name]

## 2016-09-17 NOTE — Telephone Encounter (Signed)
[place] of abd scheduled at [facility] 09/24/16@9am , pt to arrive there at 8:45am. Pt to be NPO after midnight. Left message for pt to call back.

## 2016-09-19 NOTE — Telephone Encounter (Signed)
Spoke with pt and she is aware of appt and prep. 

## 2016-09-24 ENCOUNTER — Ambulatory Visit (HOSPITAL_COMMUNITY)
Admission: RE | Admit: 2016-09-24 | Discharge: 2016-09-24 | Disposition: A | Discharge status: Home / Self Care | Payer: Medicare Other | Source: Ambulatory Visit | Attending: Internal Medicine | Admitting: Internal Medicine

## 2016-09-24 DIAGNOSIS — K8689 Other specified diseases of pancreas: Secondary | ICD-10-CM | POA: Diagnosis not present

## 2016-09-24 DIAGNOSIS — N200 Calculus of kidney: Secondary | ICD-10-CM | POA: Diagnosis not present

## 2016-09-24 DIAGNOSIS — K7689 Other specified diseases of liver: Secondary | ICD-10-CM | POA: Diagnosis not present

## 2016-11-14 DIAGNOSIS — M65342 Trigger finger, left ring finger: Secondary | ICD-10-CM | POA: Diagnosis not present

## 2016-11-14 DIAGNOSIS — Z6828 Body mass index (BMI) 28.0-28.9, adult: Secondary | ICD-10-CM | POA: Diagnosis not present

## 2016-11-14 DIAGNOSIS — M791 Myalgia: Secondary | ICD-10-CM | POA: Diagnosis not present

## 2016-11-14 DIAGNOSIS — M2550 Pain in unspecified joint: Secondary | ICD-10-CM | POA: Diagnosis not present

## 2016-11-14 DIAGNOSIS — Z79899 Other long term (current) drug therapy: Secondary | ICD-10-CM | POA: Diagnosis not present

## 2016-11-18 DIAGNOSIS — M791 Myalgia: Secondary | ICD-10-CM | POA: Diagnosis not present

## 2016-11-20 DIAGNOSIS — R748 Abnormal levels of other serum enzymes: Secondary | ICD-10-CM | POA: Diagnosis not present

## 2016-11-26 DIAGNOSIS — M65342 Trigger finger, left ring finger: Secondary | ICD-10-CM | POA: Diagnosis not present

## 2016-12-23 DIAGNOSIS — J029 Acute pharyngitis, unspecified: Secondary | ICD-10-CM | POA: Diagnosis not present

## 2016-12-23 DIAGNOSIS — Z6828 Body mass index (BMI) 28.0-28.9, adult: Secondary | ICD-10-CM | POA: Diagnosis not present

## 2016-12-23 DIAGNOSIS — J069 Acute upper respiratory infection, unspecified: Secondary | ICD-10-CM | POA: Diagnosis not present

## 2016-12-26 DIAGNOSIS — M65342 Trigger finger, left ring finger: Secondary | ICD-10-CM | POA: Diagnosis not present

## 2017-03-12 DIAGNOSIS — Z0000 Encounter for general adult medical examination without abnormal findings: Secondary | ICD-10-CM | POA: Diagnosis not present

## 2017-03-12 DIAGNOSIS — N183 Chronic kidney disease, stage 3 (moderate): Secondary | ICD-10-CM | POA: Diagnosis not present

## 2017-03-12 DIAGNOSIS — E7849 Other hyperlipidemia: Secondary | ICD-10-CM | POA: Diagnosis not present

## 2017-03-12 DIAGNOSIS — R7309 Other abnormal glucose: Secondary | ICD-10-CM | POA: Diagnosis not present

## 2017-03-19 DIAGNOSIS — Z0000 Encounter for general adult medical examination without abnormal findings: Secondary | ICD-10-CM | POA: Diagnosis not present

## 2017-03-19 DIAGNOSIS — K7689 Other specified diseases of liver: Secondary | ICD-10-CM | POA: Diagnosis not present

## 2017-03-19 DIAGNOSIS — Z6828 Body mass index (BMI) 28.0-28.9, adult: Secondary | ICD-10-CM | POA: Diagnosis not present

## 2017-03-19 DIAGNOSIS — M65342 Trigger finger, left ring finger: Secondary | ICD-10-CM | POA: Diagnosis not present

## 2017-03-19 DIAGNOSIS — N183 Chronic kidney disease, stage 3 (moderate): Secondary | ICD-10-CM | POA: Diagnosis not present

## 2017-03-19 DIAGNOSIS — Z23 Encounter for immunization: Secondary | ICD-10-CM | POA: Diagnosis not present

## 2017-03-19 DIAGNOSIS — Z1389 Encounter for screening for other disorder: Secondary | ICD-10-CM | POA: Diagnosis not present

## 2017-03-19 DIAGNOSIS — E7849 Other hyperlipidemia: Secondary | ICD-10-CM | POA: Diagnosis not present

## 2017-03-19 DIAGNOSIS — Z87891 Personal history of nicotine dependence: Secondary | ICD-10-CM | POA: Diagnosis not present

## 2017-03-19 DIAGNOSIS — J189 Pneumonia, unspecified organism: Secondary | ICD-10-CM | POA: Diagnosis not present

## 2017-03-19 DIAGNOSIS — R7309 Other abnormal glucose: Secondary | ICD-10-CM | POA: Diagnosis not present

## 2017-03-20 DIAGNOSIS — Z1231 Encounter for screening mammogram for malignant neoplasm of breast: Secondary | ICD-10-CM | POA: Diagnosis not present

## 2017-03-24 DIAGNOSIS — Z1212 Encounter for screening for malignant neoplasm of rectum: Secondary | ICD-10-CM | POA: Diagnosis not present

## 2017-04-01 DIAGNOSIS — H04123 Dry eye syndrome of bilateral lacrimal glands: Secondary | ICD-10-CM | POA: Diagnosis not present

## 2017-04-01 DIAGNOSIS — H524 Presbyopia: Secondary | ICD-10-CM | POA: Diagnosis not present

## 2017-04-01 DIAGNOSIS — H2513 Age-related nuclear cataract, bilateral: Secondary | ICD-10-CM | POA: Diagnosis not present

## 2017-08-22 DIAGNOSIS — M79621 Pain in right upper arm: Secondary | ICD-10-CM | POA: Diagnosis not present

## 2017-08-22 DIAGNOSIS — R030 Elevated blood-pressure reading, without diagnosis of hypertension: Secondary | ICD-10-CM | POA: Diagnosis not present

## 2017-08-22 DIAGNOSIS — M2550 Pain in unspecified joint: Secondary | ICD-10-CM | POA: Diagnosis not present

## 2017-08-22 DIAGNOSIS — E7849 Other hyperlipidemia: Secondary | ICD-10-CM | POA: Diagnosis not present

## 2017-08-22 DIAGNOSIS — M791 Myalgia, unspecified site: Secondary | ICD-10-CM | POA: Diagnosis not present

## 2017-08-22 DIAGNOSIS — Z6828 Body mass index (BMI) 28.0-28.9, adult: Secondary | ICD-10-CM | POA: Diagnosis not present

## 2017-09-02 DIAGNOSIS — M609 Myositis, unspecified: Secondary | ICD-10-CM | POA: Diagnosis not present

## 2017-09-02 DIAGNOSIS — Z6828 Body mass index (BMI) 28.0-28.9, adult: Secondary | ICD-10-CM | POA: Diagnosis not present

## 2017-09-02 DIAGNOSIS — I10 Essential (primary) hypertension: Secondary | ICD-10-CM | POA: Diagnosis not present

## 2017-09-18 DIAGNOSIS — I10 Essential (primary) hypertension: Secondary | ICD-10-CM | POA: Diagnosis not present

## 2017-09-18 DIAGNOSIS — F418 Other specified anxiety disorders: Secondary | ICD-10-CM | POA: Diagnosis not present

## 2017-09-18 DIAGNOSIS — Z6828 Body mass index (BMI) 28.0-28.9, adult: Secondary | ICD-10-CM | POA: Diagnosis not present

## 2017-09-18 DIAGNOSIS — M609 Myositis, unspecified: Secondary | ICD-10-CM | POA: Diagnosis not present

## 2017-10-15 DIAGNOSIS — E663 Overweight: Secondary | ICD-10-CM | POA: Diagnosis not present

## 2017-10-15 DIAGNOSIS — Z6828 Body mass index (BMI) 28.0-28.9, adult: Secondary | ICD-10-CM | POA: Diagnosis not present

## 2017-10-15 DIAGNOSIS — R748 Abnormal levels of other serum enzymes: Secondary | ICD-10-CM | POA: Diagnosis not present

## 2017-10-31 DIAGNOSIS — Z6827 Body mass index (BMI) 27.0-27.9, adult: Secondary | ICD-10-CM | POA: Diagnosis not present

## 2017-10-31 DIAGNOSIS — J069 Acute upper respiratory infection, unspecified: Secondary | ICD-10-CM | POA: Diagnosis not present

## 2017-10-31 DIAGNOSIS — R05 Cough: Secondary | ICD-10-CM | POA: Diagnosis not present

## 2017-12-30 DIAGNOSIS — Z6827 Body mass index (BMI) 27.0-27.9, adult: Secondary | ICD-10-CM | POA: Diagnosis not present

## 2017-12-30 DIAGNOSIS — E7849 Other hyperlipidemia: Secondary | ICD-10-CM | POA: Diagnosis not present

## 2017-12-30 DIAGNOSIS — M609 Myositis, unspecified: Secondary | ICD-10-CM | POA: Diagnosis not present

## 2017-12-30 DIAGNOSIS — I10 Essential (primary) hypertension: Secondary | ICD-10-CM | POA: Diagnosis not present

## 2017-12-30 DIAGNOSIS — J069 Acute upper respiratory infection, unspecified: Secondary | ICD-10-CM | POA: Diagnosis not present

## 2018-01-22 DIAGNOSIS — M791 Myalgia, unspecified site: Secondary | ICD-10-CM | POA: Diagnosis not present

## 2018-01-22 DIAGNOSIS — T466X5A Adverse effect of antihyperlipidemic and antiarteriosclerotic drugs, initial encounter: Secondary | ICD-10-CM | POA: Diagnosis not present

## 2018-01-22 DIAGNOSIS — E7800 Pure hypercholesterolemia, unspecified: Secondary | ICD-10-CM | POA: Diagnosis not present

## 2018-01-22 DIAGNOSIS — R748 Abnormal levels of other serum enzymes: Secondary | ICD-10-CM | POA: Diagnosis not present

## 2018-02-12 DIAGNOSIS — I2510 Atherosclerotic heart disease of native coronary artery without angina pectoris: Secondary | ICD-10-CM | POA: Diagnosis not present

## 2018-02-12 DIAGNOSIS — R931 Abnormal findings on diagnostic imaging of heart and coronary circulation: Secondary | ICD-10-CM | POA: Diagnosis not present

## 2018-02-12 DIAGNOSIS — E7800 Pure hypercholesterolemia, unspecified: Secondary | ICD-10-CM | POA: Diagnosis not present

## 2018-02-12 DIAGNOSIS — M791 Myalgia, unspecified site: Secondary | ICD-10-CM | POA: Diagnosis not present

## 2018-02-23 DIAGNOSIS — I2510 Atherosclerotic heart disease of native coronary artery without angina pectoris: Secondary | ICD-10-CM | POA: Diagnosis not present

## 2018-02-23 DIAGNOSIS — I10 Essential (primary) hypertension: Secondary | ICD-10-CM | POA: Diagnosis not present

## 2018-02-26 DIAGNOSIS — E7800 Pure hypercholesterolemia, unspecified: Secondary | ICD-10-CM | POA: Diagnosis not present

## 2018-02-26 DIAGNOSIS — I2510 Atherosclerotic heart disease of native coronary artery without angina pectoris: Secondary | ICD-10-CM | POA: Diagnosis not present

## 2018-02-26 DIAGNOSIS — R931 Abnormal findings on diagnostic imaging of heart and coronary circulation: Secondary | ICD-10-CM | POA: Diagnosis not present

## 2018-03-10 DIAGNOSIS — Z6827 Body mass index (BMI) 27.0-27.9, adult: Secondary | ICD-10-CM | POA: Diagnosis not present

## 2018-03-10 DIAGNOSIS — M25562 Pain in left knee: Secondary | ICD-10-CM | POA: Diagnosis not present

## 2018-03-12 DIAGNOSIS — M1712 Unilateral primary osteoarthritis, left knee: Secondary | ICD-10-CM | POA: Diagnosis not present

## 2018-03-25 DIAGNOSIS — R82998 Other abnormal findings in urine: Secondary | ICD-10-CM | POA: Diagnosis not present

## 2018-03-25 DIAGNOSIS — E7849 Other hyperlipidemia: Secondary | ICD-10-CM | POA: Diagnosis not present

## 2018-03-25 DIAGNOSIS — R7309 Other abnormal glucose: Secondary | ICD-10-CM | POA: Diagnosis not present

## 2018-03-25 DIAGNOSIS — I10 Essential (primary) hypertension: Secondary | ICD-10-CM | POA: Diagnosis not present

## 2018-03-27 DIAGNOSIS — Z23 Encounter for immunization: Secondary | ICD-10-CM | POA: Diagnosis not present

## 2018-03-31 DIAGNOSIS — Z124 Encounter for screening for malignant neoplasm of cervix: Secondary | ICD-10-CM | POA: Diagnosis not present

## 2018-03-31 DIAGNOSIS — Z6827 Body mass index (BMI) 27.0-27.9, adult: Secondary | ICD-10-CM | POA: Diagnosis not present

## 2018-03-31 DIAGNOSIS — Z1231 Encounter for screening mammogram for malignant neoplasm of breast: Secondary | ICD-10-CM | POA: Diagnosis not present

## 2018-04-01 DIAGNOSIS — R7309 Other abnormal glucose: Secondary | ICD-10-CM | POA: Diagnosis not present

## 2018-04-01 DIAGNOSIS — F418 Other specified anxiety disorders: Secondary | ICD-10-CM | POA: Diagnosis not present

## 2018-04-01 DIAGNOSIS — I129 Hypertensive chronic kidney disease with stage 1 through stage 4 chronic kidney disease, or unspecified chronic kidney disease: Secondary | ICD-10-CM | POA: Diagnosis not present

## 2018-04-01 DIAGNOSIS — Z6827 Body mass index (BMI) 27.0-27.9, adult: Secondary | ICD-10-CM | POA: Diagnosis not present

## 2018-04-01 DIAGNOSIS — K769 Liver disease, unspecified: Secondary | ICD-10-CM | POA: Diagnosis not present

## 2018-04-01 DIAGNOSIS — I10 Essential (primary) hypertension: Secondary | ICD-10-CM | POA: Diagnosis not present

## 2018-04-01 DIAGNOSIS — Z1389 Encounter for screening for other disorder: Secondary | ICD-10-CM | POA: Diagnosis not present

## 2018-04-01 DIAGNOSIS — Z0000 Encounter for general adult medical examination without abnormal findings: Secondary | ICD-10-CM | POA: Diagnosis not present

## 2018-04-01 DIAGNOSIS — N183 Chronic kidney disease, stage 3 (moderate): Secondary | ICD-10-CM | POA: Diagnosis not present

## 2018-04-01 DIAGNOSIS — Z23 Encounter for immunization: Secondary | ICD-10-CM | POA: Diagnosis not present

## 2018-04-01 DIAGNOSIS — E7849 Other hyperlipidemia: Secondary | ICD-10-CM | POA: Diagnosis not present

## 2018-04-03 DIAGNOSIS — H5201 Hypermetropia, right eye: Secondary | ICD-10-CM | POA: Diagnosis not present

## 2018-04-03 DIAGNOSIS — H5212 Myopia, left eye: Secondary | ICD-10-CM | POA: Diagnosis not present

## 2018-04-03 DIAGNOSIS — H2513 Age-related nuclear cataract, bilateral: Secondary | ICD-10-CM | POA: Diagnosis not present

## 2018-04-10 DIAGNOSIS — Z1212 Encounter for screening for malignant neoplasm of rectum: Secondary | ICD-10-CM | POA: Diagnosis not present

## 2018-06-10 DIAGNOSIS — B029 Zoster without complications: Secondary | ICD-10-CM | POA: Diagnosis not present

## 2018-06-10 DIAGNOSIS — H10409 Unspecified chronic conjunctivitis, unspecified eye: Secondary | ICD-10-CM | POA: Diagnosis not present

## 2018-06-10 DIAGNOSIS — Z6827 Body mass index (BMI) 27.0-27.9, adult: Secondary | ICD-10-CM | POA: Diagnosis not present

## 2018-07-10 DIAGNOSIS — L209 Atopic dermatitis, unspecified: Secondary | ICD-10-CM | POA: Diagnosis not present

## 2018-07-10 DIAGNOSIS — L2089 Other atopic dermatitis: Secondary | ICD-10-CM | POA: Diagnosis not present

## 2018-07-10 DIAGNOSIS — H1045 Other chronic allergic conjunctivitis: Secondary | ICD-10-CM | POA: Diagnosis not present

## 2018-07-10 DIAGNOSIS — J3089 Other allergic rhinitis: Secondary | ICD-10-CM | POA: Diagnosis not present

## 2018-08-24 ENCOUNTER — Ambulatory Visit: Payer: Self-pay | Admitting: Cardiology

## 2019-03-26 DIAGNOSIS — Z23 Encounter for immunization: Secondary | ICD-10-CM | POA: Diagnosis not present

## 2019-03-29 DIAGNOSIS — R7309 Other abnormal glucose: Secondary | ICD-10-CM | POA: Diagnosis not present

## 2019-03-29 DIAGNOSIS — E7849 Other hyperlipidemia: Secondary | ICD-10-CM | POA: Diagnosis not present

## 2019-03-30 DIAGNOSIS — Z1212 Encounter for screening for malignant neoplasm of rectum: Secondary | ICD-10-CM | POA: Diagnosis not present

## 2019-03-30 DIAGNOSIS — R82998 Other abnormal findings in urine: Secondary | ICD-10-CM | POA: Diagnosis not present

## 2019-03-30 DIAGNOSIS — I10 Essential (primary) hypertension: Secondary | ICD-10-CM | POA: Diagnosis not present

## 2019-04-05 DIAGNOSIS — Z0000 Encounter for general adult medical examination without abnormal findings: Secondary | ICD-10-CM | POA: Diagnosis not present

## 2019-04-05 DIAGNOSIS — U071 COVID-19: Secondary | ICD-10-CM | POA: Diagnosis not present

## 2019-04-05 DIAGNOSIS — N183 Chronic kidney disease, stage 3 unspecified: Secondary | ICD-10-CM | POA: Diagnosis not present

## 2019-04-05 DIAGNOSIS — E785 Hyperlipidemia, unspecified: Secondary | ICD-10-CM | POA: Diagnosis not present

## 2019-04-05 DIAGNOSIS — I129 Hypertensive chronic kidney disease with stage 1 through stage 4 chronic kidney disease, or unspecified chronic kidney disease: Secondary | ICD-10-CM | POA: Diagnosis not present

## 2019-04-05 DIAGNOSIS — I2584 Coronary atherosclerosis due to calcified coronary lesion: Secondary | ICD-10-CM | POA: Diagnosis not present

## 2019-04-05 DIAGNOSIS — Z1331 Encounter for screening for depression: Secondary | ICD-10-CM | POA: Diagnosis not present

## 2019-04-05 DIAGNOSIS — R7309 Other abnormal glucose: Secondary | ICD-10-CM | POA: Diagnosis not present

## 2019-04-05 DIAGNOSIS — M25562 Pain in left knee: Secondary | ICD-10-CM | POA: Diagnosis not present

## 2019-04-05 DIAGNOSIS — K769 Liver disease, unspecified: Secondary | ICD-10-CM | POA: Diagnosis not present

## 2019-04-05 DIAGNOSIS — K5900 Constipation, unspecified: Secondary | ICD-10-CM | POA: Diagnosis not present

## 2019-04-07 DIAGNOSIS — Z1231 Encounter for screening mammogram for malignant neoplasm of breast: Secondary | ICD-10-CM | POA: Diagnosis not present

## 2019-04-09 DIAGNOSIS — H10413 Chronic giant papillary conjunctivitis, bilateral: Secondary | ICD-10-CM | POA: Diagnosis not present

## 2019-04-09 DIAGNOSIS — H2513 Age-related nuclear cataract, bilateral: Secondary | ICD-10-CM | POA: Diagnosis not present

## 2019-04-09 DIAGNOSIS — H524 Presbyopia: Secondary | ICD-10-CM | POA: Diagnosis not present

## 2019-07-11 ENCOUNTER — Ambulatory Visit: Payer: Medicare Other | Attending: Internal Medicine

## 2019-07-11 DIAGNOSIS — Z23 Encounter for immunization: Secondary | ICD-10-CM

## 2019-07-11 NOTE — Progress Notes (Signed)
   Covid-19 Vaccination Clinic  Name:  Madison [name]    MRN: [id number] DOB: 17-Sep-1950  07/11/2019  [name] was observed post Covid-19 immunization for 15 minutes without incidence. She was provided with Vaccine Information Sheet and instruction to access the V-Safe system.   [name] was instructed to call 911 with any severe reactions post vaccine: Marland Kitchen Difficulty breathing  . Swelling of your face and throat  . A fast heartbeat  . A bad rash all over your body  . Dizziness and weakness    Immunizations Administered    Name Date Dose VIS Date Route   Pfizer COVID-19 Vaccine 07/11/2019 12:57 PM 0.3 mL 05/07/2019 Intramuscular   Manufacturer: [place]   Lot: [id number]   [place]: [id number]

## 2019-08-03 ENCOUNTER — Ambulatory Visit: Payer: Medicare Other | Attending: Internal Medicine

## 2019-08-03 DIAGNOSIS — Z23 Encounter for immunization: Secondary | ICD-10-CM | POA: Insufficient documentation

## 2019-08-03 NOTE — Progress Notes (Signed)
   Covid-19 Vaccination Clinic  Name:  Madison [name]    MRN: [id number] DOB: 08/03/1950  08/03/2019  [name] was observed post Covid-19 immunization for 15 minutes without incident. She was provided with Vaccine Information Sheet and instruction to access the V-Safe system.   [name] was instructed to call 911 with any severe reactions post vaccine: Marland Kitchen Difficulty breathing  . Swelling of face and throat  . A fast heartbeat  . A bad rash all over body  . Dizziness and weakness   Immunizations Administered    Name Date Dose VIS Date Route   Pfizer COVID-19 Vaccine 08/03/2019  6:16 PM 0.3 mL 05/07/2019 Intramuscular   Manufacturer: [place]   Lot: [id number]   [place]: [id number]

## 2019-09-27 DIAGNOSIS — H1131 Conjunctival hemorrhage, right eye: Secondary | ICD-10-CM | POA: Diagnosis not present

## 2019-09-27 DIAGNOSIS — H2513 Age-related nuclear cataract, bilateral: Secondary | ICD-10-CM | POA: Diagnosis not present

## 2020-03-02 DIAGNOSIS — Z1152 Encounter for screening for COVID-19: Secondary | ICD-10-CM | POA: Diagnosis not present

## 2020-03-02 DIAGNOSIS — J069 Acute upper respiratory infection, unspecified: Secondary | ICD-10-CM | POA: Diagnosis not present

## 2020-03-02 DIAGNOSIS — J029 Acute pharyngitis, unspecified: Secondary | ICD-10-CM | POA: Diagnosis not present

## 2020-03-02 DIAGNOSIS — R059 Cough, unspecified: Secondary | ICD-10-CM | POA: Diagnosis not present

## 2020-03-27 DIAGNOSIS — Z23 Encounter for immunization: Secondary | ICD-10-CM | POA: Diagnosis not present

## 2020-04-05 DIAGNOSIS — E785 Hyperlipidemia, unspecified: Secondary | ICD-10-CM | POA: Diagnosis not present

## 2020-04-05 DIAGNOSIS — R7309 Other abnormal glucose: Secondary | ICD-10-CM | POA: Diagnosis not present

## 2020-04-05 DIAGNOSIS — Z20822 Contact with and (suspected) exposure to covid-19: Secondary | ICD-10-CM | POA: Diagnosis not present

## 2020-04-05 DIAGNOSIS — I10 Essential (primary) hypertension: Secondary | ICD-10-CM | POA: Diagnosis not present

## 2020-04-05 DIAGNOSIS — Z0000 Encounter for general adult medical examination without abnormal findings: Secondary | ICD-10-CM | POA: Diagnosis not present

## 2020-04-09 DIAGNOSIS — Z20822 Contact with and (suspected) exposure to covid-19: Secondary | ICD-10-CM | POA: Diagnosis not present

## 2020-04-10 DIAGNOSIS — F418 Other specified anxiety disorders: Secondary | ICD-10-CM | POA: Diagnosis not present

## 2020-04-10 DIAGNOSIS — I10 Essential (primary) hypertension: Secondary | ICD-10-CM | POA: Diagnosis not present

## 2020-04-10 DIAGNOSIS — Z0000 Encounter for general adult medical examination without abnormal findings: Secondary | ICD-10-CM | POA: Diagnosis not present

## 2020-04-10 DIAGNOSIS — I2584 Coronary atherosclerosis due to calcified coronary lesion: Secondary | ICD-10-CM | POA: Diagnosis not present

## 2020-04-10 DIAGNOSIS — E785 Hyperlipidemia, unspecified: Secondary | ICD-10-CM | POA: Diagnosis not present

## 2020-04-10 DIAGNOSIS — R82998 Other abnormal findings in urine: Secondary | ICD-10-CM | POA: Diagnosis not present

## 2020-04-10 DIAGNOSIS — Z23 Encounter for immunization: Secondary | ICD-10-CM | POA: Diagnosis not present

## 2020-04-10 DIAGNOSIS — R8290 Unspecified abnormal findings in urine: Secondary | ICD-10-CM | POA: Diagnosis not present

## 2020-04-10 DIAGNOSIS — H2513 Age-related nuclear cataract, bilateral: Secondary | ICD-10-CM | POA: Diagnosis not present

## 2020-04-10 DIAGNOSIS — K219 Gastro-esophageal reflux disease without esophagitis: Secondary | ICD-10-CM | POA: Diagnosis not present

## 2020-04-10 DIAGNOSIS — H524 Presbyopia: Secondary | ICD-10-CM | POA: Diagnosis not present

## 2020-04-10 DIAGNOSIS — L989 Disorder of the skin and subcutaneous tissue, unspecified: Secondary | ICD-10-CM | POA: Diagnosis not present

## 2020-04-10 DIAGNOSIS — R7309 Other abnormal glucose: Secondary | ICD-10-CM | POA: Diagnosis not present

## 2020-04-19 DIAGNOSIS — Z1212 Encounter for screening for malignant neoplasm of rectum: Secondary | ICD-10-CM | POA: Diagnosis not present

## 2020-05-08 DIAGNOSIS — M1711 Unilateral primary osteoarthritis, right knee: Secondary | ICD-10-CM | POA: Diagnosis not present

## 2020-05-08 DIAGNOSIS — M25461 Effusion, right knee: Secondary | ICD-10-CM | POA: Diagnosis not present

## 2020-06-09 DIAGNOSIS — L218 Other seborrheic dermatitis: Secondary | ICD-10-CM | POA: Diagnosis not present

## 2020-06-09 DIAGNOSIS — L821 Other seborrheic keratosis: Secondary | ICD-10-CM | POA: Diagnosis not present

## 2020-06-09 DIAGNOSIS — D2371 Other benign neoplasm of skin of right lower limb, including hip: Secondary | ICD-10-CM | POA: Diagnosis not present

## 2020-06-09 DIAGNOSIS — D225 Melanocytic nevi of trunk: Secondary | ICD-10-CM | POA: Diagnosis not present

## 2020-06-09 DIAGNOSIS — L570 Actinic keratosis: Secondary | ICD-10-CM | POA: Diagnosis not present

## 2020-06-09 DIAGNOSIS — L814 Other melanin hyperpigmentation: Secondary | ICD-10-CM | POA: Diagnosis not present

## 2020-06-26 DIAGNOSIS — Z1231 Encounter for screening mammogram for malignant neoplasm of breast: Secondary | ICD-10-CM | POA: Diagnosis not present

## 2020-06-26 DIAGNOSIS — Z6829 Body mass index (BMI) 29.0-29.9, adult: Secondary | ICD-10-CM | POA: Diagnosis not present

## 2020-06-26 DIAGNOSIS — Z124 Encounter for screening for malignant neoplasm of cervix: Secondary | ICD-10-CM | POA: Diagnosis not present

## 2020-07-19 DIAGNOSIS — H2512 Age-related nuclear cataract, left eye: Secondary | ICD-10-CM | POA: Diagnosis not present

## 2020-08-25 DIAGNOSIS — Z8719 Personal history of other diseases of the digestive system: Secondary | ICD-10-CM

## 2020-08-25 HISTORY — DX: Personal history of other diseases of the digestive system: Z87.19

## 2020-08-29 ENCOUNTER — Other Ambulatory Visit: Payer: Self-pay

## 2020-08-29 ENCOUNTER — Encounter (HOSPITAL_COMMUNITY): Payer: Self-pay

## 2020-08-29 ENCOUNTER — Emergency Department (HOSPITAL_COMMUNITY): Payer: Medicare Other

## 2020-08-29 ENCOUNTER — Emergency Department (HOSPITAL_COMMUNITY)
Admission: EM | Admit: 2020-08-29 | Discharge: 2020-08-29 | Disposition: A | Discharge status: Home / Self Care | Payer: Medicare Other | Attending: Emergency Medicine | Admitting: Emergency Medicine

## 2020-08-29 DIAGNOSIS — Z7982 Long term (current) use of aspirin: Secondary | ICD-10-CM | POA: Diagnosis not present

## 2020-08-29 DIAGNOSIS — R109 Unspecified abdominal pain: Secondary | ICD-10-CM | POA: Diagnosis present

## 2020-08-29 DIAGNOSIS — K6389 Other specified diseases of intestine: Secondary | ICD-10-CM | POA: Diagnosis not present

## 2020-08-29 DIAGNOSIS — K5732 Diverticulitis of large intestine without perforation or abscess without bleeding: Secondary | ICD-10-CM

## 2020-08-29 DIAGNOSIS — K5792 Diverticulitis of intestine, part unspecified, without perforation or abscess without bleeding: Secondary | ICD-10-CM | POA: Diagnosis not present

## 2020-08-29 DIAGNOSIS — Z87891 Personal history of nicotine dependence: Secondary | ICD-10-CM | POA: Diagnosis not present

## 2020-08-29 DIAGNOSIS — I700 Atherosclerosis of aorta: Secondary | ICD-10-CM | POA: Diagnosis not present

## 2020-08-29 DIAGNOSIS — N200 Calculus of kidney: Secondary | ICD-10-CM | POA: Diagnosis not present

## 2020-08-29 DIAGNOSIS — M47819 Spondylosis without myelopathy or radiculopathy, site unspecified: Secondary | ICD-10-CM | POA: Diagnosis not present

## 2020-08-29 LAB — URINALYSIS, ROUTINE W REFLEX MICROSCOPIC
Bilirubin Urine: NEGATIVE
Glucose, UA: NEGATIVE mg/dL
Hgb urine dipstick: NEGATIVE
Ketones, ur: NEGATIVE mg/dL
Leukocytes,Ua: NEGATIVE
Nitrite: NEGATIVE
Protein, ur: NEGATIVE mg/dL
Specific Gravity, Urine: 1.005 (ref 1.005–1.030)
pH: 7 (ref 5.0–8.0)

## 2020-08-29 LAB — CBC WITH DIFFERENTIAL/PLATELET
Abs Immature Granulocytes: 0.02 10*3/uL (ref 0.00–0.07)
Basophils Absolute: 0 10*3/uL (ref 0.0–0.1)
Basophils Relative: 0 %
Eosinophils Absolute: 0.1 10*3/uL (ref 0.0–0.5)
Eosinophils Relative: 1 %
HCT: 39.2 % (ref 36.0–46.0)
Hemoglobin: 13.4 g/dL (ref 12.0–15.0)
Immature Granulocytes: 0 %
Lymphocytes Relative: 28 %
Lymphs Abs: 2.5 10*3/uL (ref 0.7–4.0)
MCH: 31.1 pg (ref 26.0–34.0)
MCHC: 34.2 g/dL (ref 30.0–36.0)
MCV: 91 fL (ref 80.0–100.0)
Monocytes Absolute: 0.7 10*3/uL (ref 0.1–1.0)
Monocytes Relative: 7 %
Neutro Abs: 5.6 10*3/uL (ref 1.7–7.7)
Neutrophils Relative %: 64 %
Platelets: 236 10*3/uL (ref 150–400)
RBC: 4.31 MIL/uL (ref 3.87–5.11)
RDW: 12.1 % (ref 11.5–15.5)
WBC: 8.9 10*3/uL (ref 4.0–10.5)
nRBC: 0 % (ref 0.0–0.2)

## 2020-08-29 LAB — COMPREHENSIVE METABOLIC PANEL
ALT: 20 U/L (ref 0–44)
AST: 17 U/L (ref 15–41)
Albumin: 4.3 g/dL (ref 3.5–5.0)
Alkaline Phosphatase: 55 U/L (ref 38–126)
Anion gap: 8 (ref 5–15)
BUN: 19 mg/dL (ref 8–23)
CO2: 26 mmol/L (ref 22–32)
Calcium: 9.4 mg/dL (ref 8.9–10.3)
Chloride: 104 mmol/L (ref 98–111)
Creatinine, Ser: 1.1 mg/dL — ABNORMAL HIGH (ref 0.44–1.00)
GFR, Estimated: 54 mL/min — ABNORMAL LOW (ref >60)
Glucose, Bld: 119 mg/dL — ABNORMAL HIGH (ref 70–99)
Potassium: 3.5 mmol/L (ref 3.5–5.1)
Sodium: 138 mmol/L (ref 135–145)
Total Bilirubin: 0.9 mg/dL (ref 0.3–1.2)
Total Protein: 7.3 g/dL (ref 6.5–8.1)

## 2020-08-29 LAB — LIPASE, BLOOD: Lipase: 35 U/L (ref 11–51)

## 2020-08-29 MED ORDER — METRONIDAZOLE IN NACL 5-0.79 MG/ML-% IV SOLN
500.0000 mg | Freq: Once | INTRAVENOUS | Status: AC
  Administered 2020-08-29: 500 mg via INTRAVENOUS
  Filled 2020-08-29: qty 100

## 2020-08-29 MED ORDER — CIPROFLOXACIN HCL 500 MG PO TABS: 500.0000 mg | ORAL_TABLET | Freq: Two times a day (BID) | ORAL | 0 refills | Status: DC

## 2020-08-29 MED ORDER — CIPROFLOXACIN IN D5W 400 MG/200ML IV SOLN
400.0000 mg | Freq: Once | INTRAVENOUS | Status: AC
  Administered 2020-08-29: 400 mg via INTRAVENOUS
  Filled 2020-08-29: qty 200

## 2020-08-29 MED ORDER — MORPHINE SULFATE (PF) 4 MG/ML IV SOLN
4.0000 mg | Freq: Once | INTRAVENOUS | Status: AC
Start: 2020-08-29 — End: 2020-08-29
  Administered 2020-08-29: 4 mg via INTRAVENOUS
  Filled 2020-08-29: qty 1

## 2020-08-29 MED ORDER — METRONIDAZOLE 500 MG PO TABS: 500.0000 mg | ORAL_TABLET | Freq: Three times a day (TID) | ORAL | 0 refills | Status: DC

## 2020-08-29 MED ORDER — OXYCODONE HCL 5 MG PO TABS: 5.0000 mg | ORAL_TABLET | ORAL | 0 refills | Status: DC | PRN

## 2020-08-29 MED ORDER — ONDANSETRON HCL 4 MG PO TABS: 4.0000 mg | ORAL_TABLET | Freq: Four times a day (QID) | ORAL | 0 refills | Status: DC | PRN

## 2020-08-29 MED ORDER — ONDANSETRON HCL 4 MG/2ML IJ SOLN
4.0000 mg | Freq: Once | INTRAMUSCULAR | Status: AC
  Administered 2020-08-29: 4 mg via INTRAVENOUS
  Filled 2020-08-29: qty 2

## 2020-08-29 NOTE — Discharge Instructions (Addendum)
Please take the antibiotics as prescribed.  Be aware that you should not drink anything with alcohol and it for as long as you are taking metronidazole.  If you do consume alcohol, you will get deathly ill.  Return to the emergency department if you start running a fever, start vomiting, or if pain is not being adequately controlled at home.

## 2020-08-29 NOTE — ED Triage Notes (Signed)
Pt sts mid left sided abdominal pain radiating to the back. Pt describes it as a bloated feeling that intensified tonight.

## 2020-08-29 NOTE — ED Provider Notes (Signed)
[facility] DEPT Provider Note   CSN: [id number] Arrival date & time: 08/29/20  0120   History Chief complaint: Abdominal pain  Madison [name] is a 70 y.o. female.  The history is provided by the patient.  She has history of hyperlipidemia, diverticulosis, liver cyst and comes in complaining of pain in her left mid abdomen which started late this afternoon.  Pain does radiate to the left lower abdomen into the left flank.  It is worse with she presses on it and also somewhat worse if she leans forward.  Nothing makes it feel better.  She rates pain at 8/10.  She denies nausea or vomiting.  She denies fever or chills.  She has had normal bowel movement today and she has been urinating normally.  She has never had pain like this before.  Past Medical History:  Diagnosis Date  . Anxiety   . Arthritis   . Colon polyps   . Diverticulosis   . HLD (hyperlipidemia)   . Liver cyst 4/17   per ultrasound  . Pneumonia     There are no problems to display for this patient.   Past Surgical History:  Procedure Laterality Date  . BUNIONECTOMY Right   . COLONOSCOPY    . HEMORROIDECTOMY    . UPPER GASTROINTESTINAL ENDOSCOPY  2010/10/11  . US ABDOMEN AND PELVIS  (ARMX HX)       OB History   No obstetric history on file.     Family History  Problem Relation Age of Onset  . Lung cancer Father   . Other Father        cardiac arrest  . Alzheimer's disease Sister     Social History   Tobacco Use  . Smoking status: Former Smoker    Types: Cigarettes    Quit date: 09/12/1995    Years since quitting: 24.9  . Smokeless tobacco: Never Used  Substance Use Topics  . Alcohol use: Yes    Alcohol/week: 14.0 standard drinks    Types: 14 Standard drinks or equivalent per week    Comment: 2 per day  . Drug use: No    Home Medications Prior to Admission medications   Medication Sig Start Date End Date Taking? Authorizing Provider  ALPRAZolam Duanne Moron) 0.5 MG  tablet Take 0.5 mg by mouth as needed for anxiety.    [provider]  aspirin 81 MG tablet Take 81 mg by mouth daily.    [provider]  Cholecalciferol (VITAMIN D-3) 1000 units CAPS Take 1 capsule by mouth 2 (two) times daily.    [provider]  Cyanocobalamin (VITAMIN B-12) 500 MCG SUBL Place 2 tablets under the tongue as needed.    [provider]  Magnesium 400 MG CAPS Take 1 capsule by mouth at bedtime.    [provider]  Multiple Vitamin (MULTIVITAMIN) tablet Take 1 tablet by mouth daily.    [provider]  Na Sulfate-K Sulfate-Mg Sulf (SUPREP BOWEL PREP KIT) 17.5-3.13-1.6 GM/180ML SOLN Suprep as directed / no substitutions 11/21/15   Irene Shipper, MD  naproxen (NAPROSYN) 500 MG tablet Take 500 mg by mouth as needed.    [provider]  omeprazole (PRILOSEC) 20 MG capsule Take 1 capsule (20 mg total) by mouth daily. Patient not taking: Reported on 11/21/2015 10/03/15   Irene Shipper, MD  saccharomyces boulardii (FLORASTOR) 250 MG capsule Take 250 mg by mouth daily. Reported on 12/05/2015    [provider]  Allergies    Dexilant [dexlansoprazole]  Review of Systems   Review of Systems  All other systems reviewed and are negative.   Physical Exam Updated Vital Signs BP 139/84 (BP Location: Left Arm)   Pulse 72   Temp 98.1 F (36.7 C)   Resp 16   Ht _0  (1.626 m)   Wt 72.6 kg   SpO2 100%   BMI 27.46 kg/m   Physical Exam Vitals and nursing note reviewed.   70 year old female, resting comfortably and in no acute distress. Vital signs are normal. Oxygen saturation is 100%, which is normal. Head is normocephalic and atraumatic. PERRLA, EOMI. Oropharynx is clear. Neck is nontender and supple without adenopathy or JVD. Back is nontender in the midline.  There is mild left CVA tenderness. Lungs are clear without rales, wheezes, or rhonchi. Chest is nontender. Heart has regular rate and rhythm without  murmur. Abdomen is soft, with mild to moderate tenderness in the left midabdomen laterally, and in the left lower quadrant.  There is no rebound or guarding.  There are no masses or hepatosplenomegaly and peristalsis is slightly hypoactive. Extremities have no cyanosis or edema, full range of motion is present. Skin is warm and dry without rash. Neurologic: Mental status is normal, cranial nerves are intact, there are no motor or sensory deficits.  ED Results / Procedures / Treatments   Labs (all labs ordered are listed, but only abnormal results are displayed) Labs Reviewed  COMPREHENSIVE METABOLIC PANEL - Abnormal; Notable for the following components:      Result Value   Glucose, Bld 119 (*)    Creatinine, Ser 1.10 (*)    GFR, Estimated 54 (*)    All other components within normal limits  URINALYSIS, ROUTINE W REFLEX MICROSCOPIC - Abnormal; Notable for the following components:   Color, Urine STRAW (*)    All other components within normal limits  LIPASE, BLOOD  CBC WITH DIFFERENTIAL/PLATELET    Radiology CT Renal Stone Study  Result Date: 08/29/2020 CLINICAL DATA:  Left abdominal pain radiating to the back. EXAM: CT ABDOMEN AND PELVIS WITHOUT CONTRAST TECHNIQUE: Multidetector CT imaging of the abdomen and pelvis was performed following the standard protocol without IV contrast. COMPARISON:  None. FINDINGS: Lower chest: Mild dependent changes in the lung bases. Hepatobiliary: No focal liver abnormality is seen. No gallstones, gallbladder wall thickening, or biliary dilatation. Pancreas: Unremarkable. No pancreatic ductal dilatation or surrounding inflammatory changes. Spleen: Normal in size without focal abnormality. Adrenals/Urinary Tract: Stone in the lower pole right kidney without obstruction. No hydronephrosis or hydroureter. No ureteral stones identified. Bladder is unremarkable. Stomach/Bowel: Stomach, small bowel, and colon are not abnormally distended. Duodenal diverticulum in  the second portion. Scattered diverticula in the colon. Focal wall thickening and pericolonic stranding in the low descending colon consistent with diverticulitis. No abscess. Follow-up after resolution of acute process recommended to exclude underlying focal lesion. Vascular/Lymphatic: Aortic atherosclerosis. No enlarged abdominal or pelvic lymph nodes. Reproductive: Uterus and bilateral adnexa are unremarkable. Other: No free air or free fluid in the abdomen. Abdominal wall musculature appears intact. Musculoskeletal: Degenerative changes in the spine. No destructive bone lesions. IMPRESSION: 1. Focal wall thickening and pericolonic stranding in the low descending colon consistent with acute diverticulitis. No abscess. Follow-up after resolution of acute process recommended to exclude underlying focal lesion. 2. Nonobstructing stones in the lower pole right kidney. 3. Aortic atherosclerosis. Aortic Atherosclerosis (ICD10-I70.0). Electronically Signed   By: Lucienne Capers M.D.   On: 08/29/2020  03:03    Procedures Procedures   Medications Ordered in ED Medications  morphine 4 MG/ML injection 4 mg (has no administration in time range)  ondansetron (ZOFRAN) injection 4 mg (has no administration in time range)  ciprofloxacin (CIPRO) IVPB 400 mg (has no administration in time range)  metroNIDAZOLE (FLAGYL) IVPB 500 mg (has no administration in time range)    ED Course  I have reviewed the triage vital signs and the nursing notes.  Pertinent labs & imaging results that were available during my care of the patient were reviewed by me and considered in my medical decision making (see chart for details).  MDM Rules/Calculators/A&P Left-sided abdominal pain.  Differential diagnosis is broad and includes conditions with significant morbidity and mortality.  Consider urolithiasis, urinary tract infection, pyelonephritis, diverticulitis, abdominal aortic aneurysm.  Old records are reviewed, and abdominal  ultrasound in 2018 showed normal aorta, 5 mm calculus in the right kidney with no calculi seen in the left kidney.  She will be given morphine for pain and will send for renal stone protocol CT scan.  4:41 AM CT scan shows evidence of diverticulitis.  Labs are still pending.  She will be started on antibiotics for diverticulitis.  6:59 AM Labs are reassuring.  WBC is normal with normal differential.  She got a dose of morphine and ondansetron and is feeling much better.  At this point, outpatient management seems appropriate.  She is discharged with prescription for ciprofloxacin and metronidazole, also prescription given for ondansetron and a small number of oxycodone tablets.  Strict return precautions given.  Follow-up with PCP after completion of course of antibiotics.  Final Clinical Impression(s) / ED Diagnoses Final diagnoses:  Diverticulitis, colon    Rx / DC Orders ED Discharge Orders         Ordered    oxyCODONE (ROXICODONE) 5 MG immediate release tablet  Every 4 hours PRN        08/29/20 0657    ondansetron (ZOFRAN) 4 MG tablet  Every 6 hours PRN        08/29/20 0657    ciprofloxacin (CIPRO) 500 MG tablet  2 times daily        08/29/20 0657    metroNIDAZOLE (FLAGYL) 500 MG tablet  3 times daily        08/29/20 0034           Delora Fuel, MD 96/11/64 [phone]

## 2020-09-13 DIAGNOSIS — K5732 Diverticulitis of large intestine without perforation or abscess without bleeding: Secondary | ICD-10-CM | POA: Diagnosis not present

## 2020-09-13 DIAGNOSIS — K5790 Diverticulosis of intestine, part unspecified, without perforation or abscess without bleeding: Secondary | ICD-10-CM | POA: Diagnosis not present

## 2020-09-13 DIAGNOSIS — F418 Other specified anxiety disorders: Secondary | ICD-10-CM | POA: Diagnosis not present

## 2020-09-13 DIAGNOSIS — I10 Essential (primary) hypertension: Secondary | ICD-10-CM | POA: Diagnosis not present

## 2020-09-13 DIAGNOSIS — K5909 Other constipation: Secondary | ICD-10-CM | POA: Diagnosis not present

## 2020-09-13 DIAGNOSIS — E785 Hyperlipidemia, unspecified: Secondary | ICD-10-CM | POA: Diagnosis not present

## 2020-11-08 ENCOUNTER — Ambulatory Visit (INDEPENDENT_AMBULATORY_CARE_PROVIDER_SITE_OTHER): Payer: Medicare Other | Admitting: Internal Medicine

## 2020-11-08 ENCOUNTER — Encounter: Payer: Self-pay | Admitting: Internal Medicine

## 2020-11-08 VITALS — BP 132/70 | HR 80 | Ht 64.0 in | Wt 164.0 lb

## 2020-11-08 DIAGNOSIS — Z860100 Personal history of colon polyps, unspecified: Secondary | ICD-10-CM

## 2020-11-08 DIAGNOSIS — R935 Abnormal findings on diagnostic imaging of other abdominal regions, including retroperitoneum: Secondary | ICD-10-CM

## 2020-11-08 DIAGNOSIS — K5732 Diverticulitis of large intestine without perforation or abscess without bleeding: Secondary | ICD-10-CM | POA: Diagnosis not present

## 2020-11-08 DIAGNOSIS — K5900 Constipation, unspecified: Secondary | ICD-10-CM

## 2020-11-08 MED ORDER — CIPROFLOXACIN HCL 500 MG PO TABS: 500.0000 mg | ORAL_TABLET | Freq: Two times a day (BID) | ORAL | 0 refills | Status: DC

## 2020-11-08 NOTE — Progress Notes (Signed)
HISTORY OF PRESENT ILLNESS:  Madison [name] is a 70 y.o. female, originally from [place], who was sent today by her primary care provider regarding a recent bout of diverticulitis.  I initially saw the patient April 2017 regarding vague epigastric discomfort, chronic constipation, and surveillance colonoscopy.  Abdominal ultrasound May 2018 revealed nonspecific cystic lesion of the liver and changes consistent with fatty liver.  Nonobstructing renal calculus as well.  Upper endoscopy was performed Oct 03, 2015.  She was found to have mild reflux esophagitis and placed on omeprazole.  Subsequent colonoscopy was performed July 2017.  She was found to have pandiverticulosis and internal hemorrhoids.  No polyps.  Follow-up in 5 years recommended due to report of multiple polyps on previous colonoscopy elsewhere.  Patient is no longer taking PPI.  She tells me that she developed severe left-sided abdominal pain which brought her to the emergency room August 29, 2020.  CT imaging revealed left-sided diverticulitis for which she was treated with ciprofloxacin and metronidazole.  Symptoms resolved by the time she completed her course of antibiotics.  She has been doing well but was encouraged by her primary providers advanced practitioner follow-up with this office.  Blood work from her visit to the emergency room revealed a normal white blood cell count of 8.9.  She has been feeling well.  She does report some vague abdominal discomfort which concerns her about the possibility of recurrent diverticulitis.  She does have a tendency toward constipation and bloating which can be troubling at times.  She did try Metamucil, but did not tolerate this agent.  Has not tried MiraLAX.  REVIEW OF SYSTEMS:  All non-GI ROS negative as otherwise stated in the HPI except for allergies, arthritis, back pain, visual change, sleeping problems  Past Medical History:  Diagnosis Date   Anxiety    Arthritis    Colon polyps     Diverticulosis    HLD (hyperlipidemia)    Hx of diverticulitis of colon 08/2020   Hyperlipidemia    Liver cyst 08/26/2015   per ultrasound   Pneumonia     Past Surgical History:  Procedure Laterality Date   BUNIONECTOMY Right    COLONOSCOPY     HEMORROIDECTOMY     UPPER GASTROINTESTINAL ENDOSCOPY  2010/10/11   US ABDOMEN AND PELVIS  (ARMX HX)      Social History Madison [name]  reports that she quit smoking about 25 years ago. Her smoking use included cigarettes. She has never used smokeless tobacco. She reports current alcohol use of about 14.0 standard drinks of alcohol per week. She reports that she does not use drugs.  family history includes Alzheimer's disease in her sister; Hypertension in her mother; Lung cancer in her father; Other in her father.  Allergies  Allergen Reactions   Dexilant [Dexlansoprazole] Diarrhea       PHYSICAL EXAMINATION: Vital signs: BP 132/70   Pulse 80   Ht 5\' 4"  (1.626 m)   Wt 164 lb (74.4 kg)   SpO2 97%   BMI 28.15 kg/m   Constitutional: generally well-appearing, no acute distress Psychiatric: alert and oriented x3, cooperative Eyes: extraocular movements intact, anicteric, conjunctiva pink Mouth: oral pharynx moist, no lesions Neck: supple no lymphadenopathy Cardiovascular: heart regular rate and rhythm, no murmur Lungs: clear to auscultation bilaterally Abdomen: soft, nontender, nondistended, no obvious ascites, no peritoneal signs, normal bowel sounds, no organomegaly Rectal: Deferred until colonoscopy Extremities: no clubbing, cyanosis, or lower extremity edema bilaterally Skin: no lesions on visible extremities Neuro:  No focal deficits.  Cranial nerves intact  ASSESSMENT:  1.  Uncomplicated diverticulitis.  Resolved 2.  History of multiple polyps elsewhere.  Last colonoscopy here negative for neoplasia.  Due for surveillance 3.  Tendency toward constipation and bloating   PLAN:  1.  Detailed educational discussion on  diverticular disease and diverticulitis. 2.  Educational literature provided on the topic as well 3.  Given a prescription of ciprofloxacin 500 mg twice daily for 1 week should she develop what she believes is a flareup and would need to initiate therapy (particularly since she is traveling this summer).  She knows to contact the office should she initiate therapy (for documentation purposes and to decide if follow-up is required). 4.  MiraLAX as needed for constipation (she did not tolerate Metamucil) 5.  Schedule surveillance colonoscopy.The nature of the procedure, as well as the risks, benefits, and alternatives were carefully and thoroughly reviewed with the patient. Ample time for discussion and questions allowed. The patient understood, was satisfied, and agreed to proceed.  A total time of 45 minutes was spent preparing to see the patient, reviewing test and x-rays, reviewing ER evaluation, obtaining comprehensive history, performing comprehensive physical examination, counseling the patient regarding her above listed issues, ordering medications, ordering advanced endoscopic procedure, and documenting clinical information in the health record

## 2020-11-08 NOTE — Patient Instructions (Signed)
If you are age 72 or older, your body mass index should be between 23-30. Your Body mass index is 28.15 kg/m. If this is out of the aforementioned range listed, please consider follow up with your Primary Care Provider.  If you are age 54 or younger, your body mass index should be between 19-25. Your Body mass index is 28.15 kg/m. If this is out of the aformentioned range listed, please consider follow up with your Primary Care Provider.   __________________________________________________________  The [facility] providers would like to encourage you to use [facility] to communicate with providers for non-urgent requests or questions.  Due to long hold times on the telephone, sending your provider a message by [facility] may be a faster and more efficient way to get a response.  Please allow 48 business hours for a response.  Please remember that this is for non-urgent requests.   We have sent the following medications to your pharmacy for you to pick up at your convenience:  Cipro  Take Miralax daily - adjust as needed

## 2020-11-30 ENCOUNTER — Encounter: Payer: Self-pay | Admitting: Internal Medicine

## 2020-12-15 ENCOUNTER — Telehealth: Payer: Self-pay | Admitting: Internal Medicine

## 2020-12-15 ENCOUNTER — Encounter: Payer: Self-pay | Admitting: Internal Medicine

## 2020-12-15 NOTE — Telephone Encounter (Signed)
I called and spoke to patient to schedule a colonoscopy for October with Dr. Henrene Pastor. Patient states that she is not at home and will callback in a few hours to schedule her colon.

## 2021-03-09 ENCOUNTER — Ambulatory Visit (AMBULATORY_SURGERY_CENTER): Payer: Medicare Other | Admitting: *Deleted

## 2021-03-09 ENCOUNTER — Other Ambulatory Visit: Payer: Self-pay

## 2021-03-09 VITALS — Ht 64.0 in | Wt 165.0 lb

## 2021-03-09 DIAGNOSIS — Z961 Presence of intraocular lens: Secondary | ICD-10-CM | POA: Diagnosis not present

## 2021-03-09 DIAGNOSIS — Z86010 Personal history of colonic polyps: Secondary | ICD-10-CM

## 2021-03-09 DIAGNOSIS — H524 Presbyopia: Secondary | ICD-10-CM | POA: Diagnosis not present

## 2021-03-09 MED ORDER — NA SULFATE-K SULFATE-MG SULF 17.5-3.13-1.6 GM/177ML PO SOLN: 1.0000 | Freq: Once | ORAL | 0 refills | Status: AC

## 2021-03-09 NOTE — Progress Notes (Signed)
No egg or soy allergy known to patient  No issues known to pt with past sedation with any surgeries or procedures Patient denies ever being told they had issues or difficulty with intubation  No FH of Malignant Hyperthermia Pt is not on diet pills Pt is not on  home 02  Pt is not on blood thinners  Pt denies issues with constipation currently taking Magnesium and having soft daily BM's per pt  No A fib or A flutter  Pt is fully vaccinated  for Covid    NO PA's for preps discussed with pt In PV today  Discussed with pt there will be an out-of-pocket cost for prep and that varies from $0 to 70 +  dollars - pt verbalized understanding   Due to the COVID-19 pandemic we are asking patients to follow certain guidelines in PV and the [facility]   Pt aware of COVID protocols and [facility] guidelines   Pt verified name, DOB, address and insurance during PV today.  Pt mailed instruction packet of Emmi video, copy of consent form to read and not return, and instructions.   PV completed over the phone.  Pt encouraged to call with questions or issues.  My Chart instructions to pt as well

## 2021-03-15 ENCOUNTER — Telehealth: Payer: Self-pay | Admitting: Internal Medicine

## 2021-03-15 DIAGNOSIS — Z86010 Personal history of colonic polyps: Secondary | ICD-10-CM

## 2021-03-15 MED ORDER — NA SULFATE-K SULFATE-MG SULF 17.5-3.13-1.6 GM/177ML PO SOLN: 1.0000 | Freq: Once | ORAL | 0 refills | Status: AC

## 2021-03-15 NOTE — Telephone Encounter (Signed)
Suprep resent to [facility] = pt called and made aware

## 2021-03-15 NOTE — Telephone Encounter (Signed)
Inbound call from pt requesting a call back stating that she never received her prep. Please advise. Thank you.

## 2021-03-23 ENCOUNTER — Encounter: Payer: Medicare Other | Admitting: Internal Medicine

## 2021-03-29 ENCOUNTER — Encounter: Payer: Self-pay | Admitting: Internal Medicine

## 2021-03-29 ENCOUNTER — Ambulatory Visit (AMBULATORY_SURGERY_CENTER): Payer: Medicare Other | Admitting: Internal Medicine

## 2021-03-29 ENCOUNTER — Other Ambulatory Visit: Payer: Self-pay

## 2021-03-29 VITALS — BP 129/71 | HR 62 | Temp 97.8°F | Resp 15 | Ht 64.0 in | Wt 160.0 lb

## 2021-03-29 DIAGNOSIS — K5900 Constipation, unspecified: Secondary | ICD-10-CM

## 2021-03-29 DIAGNOSIS — K5732 Diverticulitis of large intestine without perforation or abscess without bleeding: Secondary | ICD-10-CM | POA: Diagnosis not present

## 2021-03-29 DIAGNOSIS — D122 Benign neoplasm of ascending colon: Secondary | ICD-10-CM

## 2021-03-29 DIAGNOSIS — E669 Obesity, unspecified: Secondary | ICD-10-CM | POA: Diagnosis not present

## 2021-03-29 DIAGNOSIS — I10 Essential (primary) hypertension: Secondary | ICD-10-CM | POA: Diagnosis not present

## 2021-03-29 DIAGNOSIS — R935 Abnormal findings on diagnostic imaging of other abdominal regions, including retroperitoneum: Secondary | ICD-10-CM

## 2021-03-29 DIAGNOSIS — Z86010 Personal history of colonic polyps: Secondary | ICD-10-CM | POA: Diagnosis not present

## 2021-03-29 MED ORDER — SODIUM CHLORIDE 0.9 % IV SOLN: 500.0000 mL | INTRAVENOUS | Status: DC

## 2021-03-29 NOTE — Patient Instructions (Signed)
Impression/Recommendations:  Polyp, diverticulosis, and hemorrhoid handouts given to patient.  Resume previous diet. Continue present medications. Await pathology results.  Repeat colonoscopy in 5 years for surveillance.  YOU HAD AN ENDOSCOPIC PROCEDURE TODAY AT [facility]:   Refer to the procedure report that was given to you for any specific questions about what was found during the examination.  If the procedure report does not answer your questions, please call your gastroenterologist to clarify.  If you requested that your care partner not be given the details of your procedure findings, then the procedure report has been included in a sealed envelope for you to review at your convenience later.  YOU SHOULD EXPECT: Some feelings of bloating in the abdomen. Passage of more gas than usual.  Walking can help get rid of the air that was put into your GI tract during the procedure and reduce the bloating. If you had a lower endoscopy (such as a colonoscopy or flexible sigmoidoscopy) you may notice spotting of blood in your stool or on the toilet paper. If you underwent a bowel prep for your procedure, you may not have a normal bowel movement for a few days.  Please Note:  You might notice some irritation and congestion in your nose or some drainage.  This is from the oxygen used during your procedure.  There is no need for concern and it should clear up in a day or so.  SYMPTOMS TO REPORT IMMEDIATELY:  Following lower endoscopy (colonoscopy or flexible sigmoidoscopy):  Excessive amounts of blood in the stool  Significant tenderness or worsening of abdominal pains  Swelling of the abdomen that is new, acute  Fever of 100F or higher  For urgent or emergent issues, a gastroenterologist can be reached at any hour by calling [phone]. Do not use MyChart messaging for urgent concerns.    DIET:  We do recommend a small meal at first, but then you may proceed to your  regular diet.  Drink plenty of fluids but you should avoid alcoholic beverages for 24 hours.  ACTIVITY:  You should plan to take it easy for the rest of today and you should NOT DRIVE or use heavy machinery until tomorrow (because of the sedation medicines used during the test).    FOLLOW UP: Our staff will call the number listed on your records 48-72 hours following your procedure to check on you and address any questions or concerns that you may have regarding the information given to you following your procedure. If we do not reach you, we will leave a message.  We will attempt to reach you two times.  During this call, we will ask if you have developed any symptoms of COVID 19. If you develop any symptoms (ie: fever, flu-like symptoms, shortness of breath, cough etc.) before then, please call [phone].  If you test positive for Covid 19 in the 2 weeks post procedure, please call and report this information to [place].    If any biopsies were taken you will be contacted by phone or by letter within the next 1-3 weeks.  Please call us at [phone] if you have not heard about the biopsies in 3 weeks.    SIGNATURES/CONFIDENTIALITY: You and/or your care partner have signed paperwork which will be entered into your electronic medical record.  These signatures attest to the fact that that the information above on your After Visit Summary has been reviewed and is understood.  Full responsibility of the confidentiality of  this discharge information lies with you and/or your care-partner.

## 2021-03-29 NOTE — Op Note (Signed)
[facility] Patient Name: Madison [name] Procedure Date: 03/29/2021 2:50 PM MRN: [id number] Endoscopist: Docia Chuck. Henrene Pastor , MD Age: 70 Referring MD:  Date of Birth: 1950/07/04 Gender: Female Account #: [id number] Procedure:                Colonoscopy with cold snare polypectomy x 1 Indications:              High risk colon cancer surveillance: Personal                            history of multiple colonic polyps (elsewhere).;                            Last examination here July 2017 was negative for                            neoplasia. History of diverticulitis. Abnormal CT                            scan. Constipation Medicines:                Monitored Anesthesia Care Procedure:                Pre-Anesthesia Assessment:                           - Prior to the procedure, a History and Physical                            was performed, and patient medications and                            allergies were reviewed. The patient's tolerance of                            previous anesthesia was also reviewed. The risks                            and benefits of the procedure and the sedation                            options and risks were discussed with the patient.                            All questions were answered, and informed consent                            was obtained. Prior Anticoagulants: The patient has                            taken no previous anticoagulant or antiplatelet                            agents. ASA Grade Assessment: II - A patient with  mild systemic disease. After reviewing the risks                            and benefits, the patient was deemed in                            satisfactory condition to undergo the procedure.                           After obtaining informed consent, the colonoscope                            was passed under direct vision. Throughout the                            procedure, the  patient's blood pressure, pulse, and                            oxygen saturations were monitored continuously. The                            CF [id number] #[id number] was introduced through the anus                            and advanced to the the cecum, identified by                            appendiceal orifice and ileocecal valve. The                            ileocecal valve, appendiceal orifice, and rectum                            were photographed. The quality of the bowel                            preparation was excellent. The colonoscopy was                            performed without difficulty. The patient tolerated                            the procedure well. The bowel preparation used was                            SUPREP via split dose instruction. Scope In: 3:05:50 PM Scope Out: 3:18:59 PM Scope Withdrawal Time: 0 hours 9 minutes 51 seconds  Total Procedure Duration: 0 hours 13 minutes 9 seconds  Findings:                 A 5 mm polyp was found in the ascending colon. The                            polyp was removed with a cold  snare. Resection and                            retrieval were complete.                           Multiple small and large-mouthed diverticula were                            found in the entire colon.                           Internal hemorrhoids were found during                            retroflexion. The hemorrhoids were moderate.                           The exam was otherwise without abnormality on                            direct and retroflexion views. Complications:            No immediate complications. Estimated blood loss:                            None. Estimated Blood Loss:     Estimated blood loss: none. Impression:               - One 5 mm polyp in the ascending colon, removed                            with a cold snare. Resected and retrieved.                           - Diverticulosis in the entire examined colon.                            - Internal hemorrhoids.                           - The examination was otherwise normal on direct                            and retroflexion views. Recommendation:           - Repeat colonoscopy in 5 years for surveillance                            (history of multiple polyps).                           - Patient has a contact number available for                            emergencies. The signs and symptoms of potential  delayed complications were discussed with the                            patient. Return to normal activities tomorrow.                            Written discharge instructions were provided to the                            patient.                           - Resume previous diet.                           - Continue present medications.                           - Await pathology results. Docia Chuck. Henrene Pastor, MD 03/29/2021 3:24:48 PM This report has been signed electronically.

## 2021-03-29 NOTE — Progress Notes (Signed)
HISTORY OF PRESENT ILLNESS:  Madison [name] is a 70 y.o. female who presents today for surveillance colonoscopy.  History of multiple polyps elsewhere.  Last colonoscopy here 2017 with pandiverticulosis and hemorrhoids.  Seen in the office November 08, 2020 regarding constipation and abnormal CT scan.  REVIEW OF SYSTEMS:  All non-GI ROS negative except for  Past Medical History:  Diagnosis Date   Anxiety    Arthritis    Colon polyps    Diverticulosis    HLD (hyperlipidemia)    Hx of diverticulitis of colon 08/2020   Hyperlipidemia    Liver cyst 08/26/2015   per ultrasound   Pneumonia     Past Surgical History:  Procedure Laterality Date   BUNIONECTOMY Right    COLONOSCOPY     HEMORROIDECTOMY     POLYPECTOMY     UPPER GASTROINTESTINAL ENDOSCOPY  10/11/2010   US ABDOMEN AND PELVIS  (ARMX HX)      Social History Madison [name]  reports that she quit smoking about 25 years ago. Her smoking use included cigarettes. She has never used smokeless tobacco. She reports current alcohol use of about 14.0 standard drinks per week. She reports that she does not use drugs.  family history includes Alzheimer's disease in her sister; Hypertension in her mother; Lung cancer in her father; Other in her father.  Allergies  Allergen Reactions   Dexilant [Dexlansoprazole] Diarrhea       PHYSICAL EXAMINATION:  Vital signs: BP (!) 152/80   Pulse 60   Temp 97.8 F (36.6 C) (Temporal)   Resp 15   Ht 5\' 4"  (1.626 m)   Wt 160 lb (72.6 kg)   SpO2 95%   BMI 27.46 kg/m  General: Well-developed, well-nourished, no acute distress HEENT: Sclerae are anicteric, conjunctiva pink. Oral mucosa intact Lungs: Clear Heart: Regular Abdomen: soft, nontender, nondistended, no obvious ascites, no peritoneal signs, normal bowel sounds. No organomegaly. Extremities: No edema Psychiatric: alert and oriented x3. Cooperative     ASSESSMENT:  1.  History of multiple colon polyps elsewhere, diverticulosis  complicated by diverticulitis, abnormal CT scan and constipation.   PLAN:   1.  Colonoscopy

## 2021-03-29 NOTE — Progress Notes (Signed)
Pt's states no medical or surgical changes since previsit or office visit. 

## 2021-03-29 NOTE — Progress Notes (Signed)
Report to PACU, RN, vss, BBS= Clear.  

## 2021-04-02 ENCOUNTER — Telehealth: Payer: Self-pay

## 2021-04-02 NOTE — Telephone Encounter (Signed)
  Follow up Call-  Call back number 03/29/2021  Post procedure Call Back phone  # [phone]  Permission to leave phone message Yes  Some recent data might be hidden     Patient questions:  Do you have a fever, pain , or abdominal swelling? No. Pain Score  0 *  Have you tolerated food without any problems? Yes.    Have you been able to return to your normal activities? Yes.    Do you have any questions about your discharge instructions: Diet   No. Medications  No. Follow up visit  No.  Do you have questions or concerns about your Care? No.  Actions: * If pain score is 4 or above: No action needed, pain <4.

## 2021-04-03 ENCOUNTER — Encounter: Payer: Self-pay | Admitting: Internal Medicine

## 2021-04-24 DIAGNOSIS — E785 Hyperlipidemia, unspecified: Secondary | ICD-10-CM | POA: Diagnosis not present

## 2021-04-24 DIAGNOSIS — I10 Essential (primary) hypertension: Secondary | ICD-10-CM | POA: Diagnosis not present

## 2021-05-01 DIAGNOSIS — Z0000 Encounter for general adult medical examination without abnormal findings: Secondary | ICD-10-CM | POA: Diagnosis not present

## 2021-05-01 DIAGNOSIS — E785 Hyperlipidemia, unspecified: Secondary | ICD-10-CM | POA: Diagnosis not present

## 2021-05-01 DIAGNOSIS — M21619 Bunion of unspecified foot: Secondary | ICD-10-CM | POA: Diagnosis not present

## 2021-05-01 DIAGNOSIS — L659 Nonscarring hair loss, unspecified: Secondary | ICD-10-CM | POA: Diagnosis not present

## 2021-05-01 DIAGNOSIS — I10 Essential (primary) hypertension: Secondary | ICD-10-CM | POA: Diagnosis not present

## 2021-05-01 DIAGNOSIS — F418 Other specified anxiety disorders: Secondary | ICD-10-CM | POA: Diagnosis not present

## 2021-05-01 DIAGNOSIS — K5909 Other constipation: Secondary | ICD-10-CM | POA: Diagnosis not present

## 2021-05-01 DIAGNOSIS — K219 Gastro-esophageal reflux disease without esophagitis: Secondary | ICD-10-CM | POA: Diagnosis not present

## 2021-05-01 DIAGNOSIS — K5732 Diverticulitis of large intestine without perforation or abscess without bleeding: Secondary | ICD-10-CM | POA: Diagnosis not present

## 2021-05-01 DIAGNOSIS — Z23 Encounter for immunization: Secondary | ICD-10-CM | POA: Diagnosis not present

## 2021-05-01 DIAGNOSIS — F439 Reaction to severe stress, unspecified: Secondary | ICD-10-CM | POA: Diagnosis not present

## 2021-05-04 DIAGNOSIS — L649 Androgenic alopecia, unspecified: Secondary | ICD-10-CM | POA: Diagnosis not present

## 2021-05-25 ENCOUNTER — Other Ambulatory Visit: Payer: Self-pay | Admitting: Internal Medicine

## 2021-05-25 ENCOUNTER — Other Ambulatory Visit: Payer: Self-pay | Admitting: Obstetrics & Gynecology

## 2021-05-25 DIAGNOSIS — Z1231 Encounter for screening mammogram for malignant neoplasm of breast: Secondary | ICD-10-CM

## 2021-05-30 ENCOUNTER — Other Ambulatory Visit: Payer: Self-pay

## 2021-05-30 ENCOUNTER — Ambulatory Visit (INDEPENDENT_AMBULATORY_CARE_PROVIDER_SITE_OTHER): Payer: Medicare Other | Admitting: Podiatry

## 2021-05-30 ENCOUNTER — Encounter: Payer: Self-pay | Admitting: Podiatry

## 2021-05-30 ENCOUNTER — Ambulatory Visit (INDEPENDENT_AMBULATORY_CARE_PROVIDER_SITE_OTHER): Payer: Medicare Other

## 2021-05-30 DIAGNOSIS — M21619 Bunion of unspecified foot: Secondary | ICD-10-CM

## 2021-05-30 DIAGNOSIS — M21612 Bunion of left foot: Secondary | ICD-10-CM

## 2021-05-30 DIAGNOSIS — M205X2 Other deformities of toe(s) (acquired), left foot: Secondary | ICD-10-CM | POA: Diagnosis not present

## 2021-05-30 NOTE — Progress Notes (Signed)
°  Subjective:  Patient ID: Madison [name], female    DOB: [birth year],   MRN: [id number]  Chief Complaint  Patient presents with   Bunions    Left foot bunion pain. Pt states that its not constant but has pain after being on her feet for too long.    71 y.o. female presents for concern of left foot bunion that she has had for years. Relates she has recently started having more constant pain in the foot especially when walking for long periods of time. Relates she had a bunion corrected on the right years ago and thinks it is time to fix the left side . Denies any other pedal complaints. Denies n/v/f/c.   Past Medical History:  Diagnosis Date   Anxiety    Arthritis    Colon polyps    Diverticulosis    HLD (hyperlipidemia)    Hx of diverticulitis of colon 08/2020   Hyperlipidemia    Liver cyst 08/26/2015   per ultrasound   Pneumonia     Objective:  Physical Exam: Vascular: DP/PT pulses 2/4 bilateral. CFT <3 seconds. Normal hair growth on digits. No edema.  Skin. No lacerations or abrasions bilateral feet.  Musculoskeletal: MMT 5/5 bilateral lower extremities in DF, PF, Inversion and Eversion. Deceased ROM in DF of ankle joint. No tenderness to medial eminence on left foot. Does relates pain with ROM of the first MPJ particularly end ROM. No increased mobility of the first ray.  HAV deformity noted to left foot.  Neurological: Sensation intact to light touch.   Assessment:   1. Hallux limitus of left foot   2. Bunion      Plan:  Patient was evaluated and treated and all questions answered. -Xrays reviewed. No acute fractures or dislocations. Does have moderate HAV deformity with degenerative changes noted at the first MPJ.  -Discussed hallux limitus and bunions and  treatement options; conservative and  Surgical management; risks, benefits, alternatives discussed. All patient's questions answered. -Discussed HAV and treatment options;conservative and surgical management; risks,  benefits, alternatives discussed. All patient's questions answered. -Discussed padding and wide shoe gear.   -Recommend continue with good supportive shoes and inserts.  -Discussed surgical options including first MPJ fusion. Discussed in great detail options as well as perioperative course. Patient would like to think about surgery after her trip to [place] at the beginning of March. She will return then to sign paperwork and schedule a time for surgery.  -Patient to return to office as needed or sooner if condition worsens.    Lorenda Peck, DPM

## 2021-06-08 DIAGNOSIS — M21611 Bunion of right foot: Secondary | ICD-10-CM | POA: Diagnosis not present

## 2021-06-08 DIAGNOSIS — M21612 Bunion of left foot: Secondary | ICD-10-CM | POA: Diagnosis not present

## 2021-06-27 ENCOUNTER — Ambulatory Visit
Admission: RE | Admit: 2021-06-27 | Discharge: 2021-06-27 | Disposition: A | Discharge status: Home / Self Care | Payer: Medicare Other | Source: Ambulatory Visit | Attending: Obstetrics & Gynecology | Admitting: Obstetrics & Gynecology

## 2021-06-27 DIAGNOSIS — Z1231 Encounter for screening mammogram for malignant neoplasm of breast: Secondary | ICD-10-CM

## 2021-06-28 ENCOUNTER — Other Ambulatory Visit: Payer: Self-pay | Admitting: Obstetrics & Gynecology

## 2021-06-28 DIAGNOSIS — R928 Other abnormal and inconclusive findings on diagnostic imaging of breast: Secondary | ICD-10-CM

## 2021-07-17 ENCOUNTER — Ambulatory Visit
Admission: RE | Admit: 2021-07-17 | Discharge: 2021-07-17 | Disposition: A | Discharge status: Home / Self Care | Payer: Medicare Other | Source: Ambulatory Visit | Attending: Obstetrics & Gynecology | Admitting: Obstetrics & Gynecology

## 2021-07-17 DIAGNOSIS — R928 Other abnormal and inconclusive findings on diagnostic imaging of breast: Secondary | ICD-10-CM

## 2021-07-17 DIAGNOSIS — R922 Inconclusive mammogram: Secondary | ICD-10-CM | POA: Diagnosis not present

## 2021-07-23 ENCOUNTER — Encounter: Payer: Self-pay | Admitting: Podiatry

## 2021-07-30 ENCOUNTER — Ambulatory Visit: Payer: Medicare Other | Admitting: Podiatry

## 2021-09-13 DIAGNOSIS — M791 Myalgia, unspecified site: Secondary | ICD-10-CM | POA: Diagnosis not present

## 2021-09-13 DIAGNOSIS — R208 Other disturbances of skin sensation: Secondary | ICD-10-CM | POA: Diagnosis not present

## 2021-09-13 DIAGNOSIS — M542 Cervicalgia: Secondary | ICD-10-CM | POA: Diagnosis not present

## 2021-09-13 DIAGNOSIS — R202 Paresthesia of skin: Secondary | ICD-10-CM | POA: Diagnosis not present

## 2021-09-13 DIAGNOSIS — R531 Weakness: Secondary | ICD-10-CM | POA: Diagnosis not present

## 2021-10-17 DIAGNOSIS — M2022 Hallux rigidus, left foot: Secondary | ICD-10-CM | POA: Diagnosis not present

## 2021-10-17 DIAGNOSIS — M205X2 Other deformities of toe(s) (acquired), left foot: Secondary | ICD-10-CM | POA: Diagnosis not present

## 2021-10-17 DIAGNOSIS — M21611 Bunion of right foot: Secondary | ICD-10-CM | POA: Diagnosis not present

## 2021-10-17 DIAGNOSIS — M25871 Other specified joint disorders, right ankle and foot: Secondary | ICD-10-CM | POA: Diagnosis not present

## 2021-10-17 DIAGNOSIS — M2021 Hallux rigidus, right foot: Secondary | ICD-10-CM | POA: Diagnosis not present

## 2021-10-17 DIAGNOSIS — M21612 Bunion of left foot: Secondary | ICD-10-CM | POA: Diagnosis not present

## 2021-10-17 DIAGNOSIS — M205X1 Other deformities of toe(s) (acquired), right foot: Secondary | ICD-10-CM | POA: Diagnosis not present

## 2021-10-17 DIAGNOSIS — M25872 Other specified joint disorders, left ankle and foot: Secondary | ICD-10-CM | POA: Diagnosis not present

## 2021-10-23 DIAGNOSIS — Z780 Asymptomatic menopausal state: Secondary | ICD-10-CM | POA: Diagnosis not present

## 2021-10-23 DIAGNOSIS — R7989 Other specified abnormal findings of blood chemistry: Secondary | ICD-10-CM | POA: Diagnosis not present

## 2021-10-23 DIAGNOSIS — E559 Vitamin D deficiency, unspecified: Secondary | ICD-10-CM | POA: Diagnosis not present

## 2021-11-12 DIAGNOSIS — M205X2 Other deformities of toe(s) (acquired), left foot: Secondary | ICD-10-CM | POA: Diagnosis not present

## 2021-11-12 DIAGNOSIS — Z01818 Encounter for other preprocedural examination: Secondary | ICD-10-CM | POA: Diagnosis not present

## 2021-11-12 DIAGNOSIS — E785 Hyperlipidemia, unspecified: Secondary | ICD-10-CM | POA: Diagnosis not present

## 2021-11-12 DIAGNOSIS — I10 Essential (primary) hypertension: Secondary | ICD-10-CM | POA: Diagnosis not present

## 2021-11-12 DIAGNOSIS — M21619 Bunion of unspecified foot: Secondary | ICD-10-CM | POA: Diagnosis not present

## 2021-11-22 ENCOUNTER — Encounter (HOSPITAL_BASED_OUTPATIENT_CLINIC_OR_DEPARTMENT_OTHER): Payer: Self-pay | Admitting: Podiatry

## 2021-11-22 NOTE — Progress Notes (Signed)
Spoke w/ via phone for pre-op interview---Madison [name] needs dos----  ISTAT and EKG             [name] results------ COVID test -----patient states asymptomatic no test needed Arrive at -------0915 NPO after MN NO Solid Food.  Clear liquids from MN until---0815 Med rec completed Medications to take morning of surgery -----Xanax PRN Diabetic medication ----- Patient instructed no nail polish to be worn day of surgery Patient instructed to bring photo id and insurance card day of surgery Patient aware to have Driver (ride ) / caregiver Husband Madison [name]   for 24 hours after surgery  Patient Special Instructions ----- Pre-Op special Istructions ----- Patient verbalized understanding of instructions that were given at this phone interview. Patient denies shortness of breath, chest pain, fever, cough at this phone interview.

## 2021-11-26 DIAGNOSIS — M2022 Hallux rigidus, left foot: Secondary | ICD-10-CM | POA: Diagnosis not present

## 2021-11-26 DIAGNOSIS — R262 Difficulty in walking, not elsewhere classified: Secondary | ICD-10-CM | POA: Diagnosis not present

## 2021-11-26 DIAGNOSIS — M205X2 Other deformities of toe(s) (acquired), left foot: Secondary | ICD-10-CM | POA: Diagnosis not present

## 2021-11-26 DIAGNOSIS — M25872 Other specified joint disorders, left ankle and foot: Secondary | ICD-10-CM | POA: Diagnosis not present

## 2021-12-03 ENCOUNTER — Encounter (HOSPITAL_BASED_OUTPATIENT_CLINIC_OR_DEPARTMENT_OTHER): Payer: Self-pay | Admitting: Anesthesiology

## 2021-12-03 ENCOUNTER — Ambulatory Visit (HOSPITAL_BASED_OUTPATIENT_CLINIC_OR_DEPARTMENT_OTHER)
Admission: RE | Admit: 2021-12-03 | Discharge: 2021-12-03 | Disposition: A | Discharge status: Home / Self Care | Payer: Medicare Other | Attending: Podiatry | Admitting: Podiatry

## 2021-12-03 ENCOUNTER — Encounter (HOSPITAL_BASED_OUTPATIENT_CLINIC_OR_DEPARTMENT_OTHER)
Admission: RE | Disposition: A | Discharge status: Home / Self Care | Payer: Self-pay | Source: Home / Self Care | Attending: Podiatry

## 2021-12-03 ENCOUNTER — Encounter (HOSPITAL_BASED_OUTPATIENT_CLINIC_OR_DEPARTMENT_OTHER): Payer: Self-pay | Admitting: Podiatry

## 2021-12-03 ENCOUNTER — Ambulatory Visit (HOSPITAL_BASED_OUTPATIENT_CLINIC_OR_DEPARTMENT_OTHER): Payer: Medicare Other

## 2021-12-03 ENCOUNTER — Other Ambulatory Visit: Payer: Self-pay

## 2021-12-03 DIAGNOSIS — Z538 Procedure and treatment not carried out for other reasons: Secondary | ICD-10-CM | POA: Insufficient documentation

## 2021-12-03 DIAGNOSIS — I10 Essential (primary) hypertension: Secondary | ICD-10-CM

## 2021-12-03 DIAGNOSIS — R9431 Abnormal electrocardiogram [ECG] [EKG]: Secondary | ICD-10-CM | POA: Insufficient documentation

## 2021-12-03 DIAGNOSIS — Z01818 Encounter for other preprocedural examination: Secondary | ICD-10-CM | POA: Diagnosis not present

## 2021-12-03 HISTORY — DX: Essential (primary) hypertension: I10

## 2021-12-03 LAB — POCT I-STAT, CHEM 8
BUN: 18 mg/dL (ref 8–23)
Calcium, Ion: 1.24 mmol/L (ref 1.15–1.40)
Chloride: 104 mmol/L (ref 98–111)
Creatinine, Ser: 0.9 mg/dL (ref 0.44–1.00)
Glucose, Bld: 112 mg/dL — ABNORMAL HIGH (ref 70–99)
HCT: 42 % (ref 36.0–46.0)
Hemoglobin: 14.3 g/dL (ref 12.0–15.0)
Potassium: 3.7 mmol/L (ref 3.5–5.1)
Sodium: 141 mmol/L (ref 135–145)
TCO2: 25 mmol/L (ref 22–32)

## 2021-12-03 SURGERY — BUNIONECTOMY
Anesthesia: General | Site: Toe | Laterality: Right

## 2021-12-03 MED ORDER — CEFAZOLIN SODIUM-DEXTROSE 2-4 GM/100ML-% IV SOLN: 2.0000 g | INTRAVENOUS | Status: DC

## 2021-12-03 MED ORDER — CEFAZOLIN SODIUM-DEXTROSE 2-4 GM/100ML-% IV SOLN
INTRAVENOUS | Status: AC
  Filled 2021-12-03: qty 100

## 2021-12-03 MED ORDER — ACETAMINOPHEN 500 MG PO TABS
ORAL_TABLET | ORAL | Status: AC
  Filled 2021-12-03: qty 2

## 2021-12-03 MED ORDER — CHLORHEXIDINE GLUCONATE CLOTH 2 % EX PADS: 6.0000 | MEDICATED_PAD | Freq: Once | CUTANEOUS | Status: DC

## 2021-12-03 MED ORDER — CELECOXIB 200 MG PO CAPS
ORAL_CAPSULE | ORAL | Status: AC
  Filled 2021-12-03: qty 1

## 2021-12-03 MED ORDER — MIDAZOLAM HCL 2 MG/2ML IJ SOLN
INTRAMUSCULAR | Status: AC
  Filled 2021-12-03: qty 2

## 2021-12-03 MED ORDER — CELECOXIB 200 MG PO CAPS
200.0000 mg | ORAL_CAPSULE | Freq: Once | ORAL | Status: AC
  Administered 2021-12-03: 200 mg via ORAL

## 2021-12-03 MED ORDER — ACETAMINOPHEN 500 MG PO TABS
1000.0000 mg | ORAL_TABLET | Freq: Once | ORAL | Status: AC
  Administered 2021-12-03: 1000 mg via ORAL

## 2021-12-03 MED ORDER — LACTATED RINGERS IV SOLN
INTRAVENOUS | Status: DC
  Administered 2021-12-03: 1000 mL via INTRAVENOUS

## 2021-12-03 MED ORDER — FENTANYL CITRATE (PF) 100 MCG/2ML IJ SOLN
INTRAMUSCULAR | Status: AC
  Filled 2021-12-03: qty 2

## 2021-12-03 SURGICAL SUPPLY — 9 items
CATH FOLEY 2WAY SLVR  5CC 16FR (CATHETERS)
CATH FOLEY 2WAY SLVR 5CC 16FR (CATHETERS) IMPLANT
GAUZE STRETCH 2X75IN STRL (MISCELLANEOUS) IMPLANT
MANIFOLD NEPTUNE II (INSTRUMENTS) IMPLANT
RASP SM TEAR CROSS CUT (RASP) IMPLANT
SCOTCHCAST PLUS 5X4 WHITE (CAST SUPPLIES) IMPLANT
SUT ETHILON 4 0 PS 2 18 (SUTURE) IMPLANT
SUT ETHILON 5 0 PS 2 18 (SUTURE) IMPLANT
SYR 3ML 18GX1 1/2 (SYRINGE) IMPLANT

## 2021-12-03 NOTE — Anesthesia Preprocedure Evaluation (Deleted)
Anesthesia Evaluation    Reviewed: Allergy & Precautions, Patient's Chart, lab work & pertinent test results, Unable to perform ROS - Chart review only  Airway        Dental   Pulmonary pneumonia, former smoker,           Cardiovascular hypertension, Pt. on medications      Neuro/Psych PSYCHIATRIC DISORDERS Anxiety negative neurological ROS     GI/Hepatic negative GI ROS, Neg liver ROS,   Endo/Other  negative endocrine ROS  Renal/GU negative Renal ROS     Musculoskeletal  (+) Arthritis ,   Abdominal   Peds  Hematology negative hematology ROS (+)   Anesthesia Other Findings   Reproductive/Obstetrics                           Anesthesia Physical Anesthesia Plan  ASA: 2  Anesthesia Plan: Regional and General   Post-op Pain Management: Regional block*, Tylenol PO (pre-op)* and Celebrex PO (pre-op)*   Induction:   PONV Risk Score and Plan: 2 and Ondansetron, Dexamethasone, Propofol infusion and Treatment may vary due to age or medical condition  Airway Management Planned: LMA  Additional Equipment:   Intra-op Plan:   Post-operative Plan: Extubation in OR  Informed Consent:   Plan Discussed with:   Anesthesia Plan Comments: (Coronary CT 01/2018 "Calcium score of 1170. This places the patient between the 90th and 100 percentile for females in this age range. It suggests extensive atherosclerotic plaque and high likelihood of at least one significant coronary narrowing. Significantly increased risk of coronary relatedevents in the next 5 years."  No follow up since that time. No comment by PCP. I believe she should be seen by cardiology prior to elective surgery given this history. )      Anesthesia Quick Evaluation

## 2021-12-03 NOTE — Progress Notes (Signed)
Surgery cancelled per Dr. Nolon Nations due to cardiac risk factor. Dr. Lissa Hoard explained potential risk to patient and patient verbalized understanding. Dr. Fritzi Mandes does not need to see patient per Dr. Lissa Hoard. Patient will follow up with Dr. Fritzi Mandes as needed.

## 2021-12-20 ENCOUNTER — Ambulatory Visit: Payer: Medicare Other | Admitting: Cardiology

## 2021-12-20 ENCOUNTER — Encounter: Payer: Self-pay | Admitting: Cardiology

## 2021-12-20 VITALS — BP 152/83 | HR 79 | Temp 98.0°F | Resp 16 | Ht 64.0 in | Wt 165.0 lb

## 2021-12-20 DIAGNOSIS — E7800 Pure hypercholesterolemia, unspecified: Secondary | ICD-10-CM

## 2021-12-20 DIAGNOSIS — R931 Abnormal findings on diagnostic imaging of heart and coronary circulation: Secondary | ICD-10-CM | POA: Diagnosis not present

## 2021-12-20 DIAGNOSIS — I2510 Atherosclerotic heart disease of native coronary artery without angina pectoris: Secondary | ICD-10-CM

## 2021-12-20 DIAGNOSIS — I10 Essential (primary) hypertension: Secondary | ICD-10-CM | POA: Diagnosis not present

## 2021-12-20 DIAGNOSIS — R9431 Abnormal electrocardiogram [ECG] [EKG]: Secondary | ICD-10-CM

## 2021-12-20 NOTE — Progress Notes (Signed)
Primary Physician/Referring:  Velna Hatchet, MD  Patient ID: Madison [name], female    DOB: [birth year], 71 y.o.   MRN: [id number]  Chief Complaint  Patient presents with   Coronary Artery Disease   Hypertension   New Patient (Initial Visit)   HPI:    Madison [name]  is a 71 y.o. Caucasian female with hypertension, hyperlipidemia, probably heterozygous familial hyperlipidemia having markedly elevated lipids for several years and has been on and off statins; however, had muscle pain with 3 different statins. Also had CK elevation with statins. CK level has remained elevated. She was evaluated by Rheumatology for evaluation of myalgia and reports workup was negative.  I had seen her last in 2019 in view of markedly elevated coronary calcium score, made the diagnosis of CAD and placed her on Repatha.  She is tolerating this without side effects.  Although she does not have an exercise regimen, stays active and denies chest pain or dyspnea.  Recently her left leg bunionectomy was canceled due to need for cardiac clearance.  She has no specific complaints from cardiac standpoint today.  Past Medical History:  Diagnosis Date   Anxiety    Arthritis    Colon polyps    Diverticulosis    HLD (hyperlipidemia)    Hx of diverticulitis of colon 08/2020   Hyperlipidemia    Hypertension    Liver cyst 08/26/2015   per ultrasound   Pneumonia    Past Surgical History:  Procedure Laterality Date   BUNIONECTOMY Right    COLONOSCOPY     HEMORROIDECTOMY     POLYPECTOMY     UPPER GASTROINTESTINAL ENDOSCOPY  10/11/2010   US ABDOMEN AND PELVIS  (ARMX HX)     Family History  Problem Relation Age of Onset   Hypertension Mother    Lung cancer Father    Other Father        cardiac arrest   Alzheimer's disease Sister    Colon cancer Neg Hx    Colon polyps Neg Hx    Esophageal cancer Neg Hx    Rectal cancer Neg Hx    Stomach cancer Neg Hx     Social History   Tobacco Use   Smoking status: Former     Packs/day: 1.00    Years: 20.00    Total pack years: 20.00    Types: Cigarettes    Quit date: 1991    Years since quitting: 32.5   Smokeless tobacco: Never  Substance Use Topics   Alcohol use: Yes    Alcohol/week: 14.0 standard drinks of alcohol    Types: 14 Standard drinks or equivalent per week    Comment: 2 per day   Marital Status: Married  ROS  Review of Systems  Cardiovascular:  Negative for chest pain, dyspnea on exertion and leg swelling.   Objective  Blood pressure (!) 152/83, pulse 79, temperature 98 F (36.7 C), resp. rate 16, height $RemoveBe'5\' 4"'NuPCIYnmF$  (1.626 m), weight 165 lb (74.8 kg), SpO2 96 %. Body mass index is 28.32 kg/m.     12/20/2021   11:26 AM 12/20/2021   11:18 AM 12/03/2021   10:00 AM  Vitals with BMI  Height  $Remov'5\' 4"'NVwMCS$  $Remove'5\' 4"'UMSlBdN$   Weight  165 lbs 160 lbs 8 oz  BMI  99.37 16.96  Systolic 789 381 017  Diastolic 83 83 78  Pulse 79 76 66    Physical Exam Neck:     Vascular: No JVD.  Cardiovascular:     Rate  and Rhythm: Normal rate and regular rhythm.     Pulses: Intact distal pulses.     Heart sounds: Normal heart sounds. No murmur heard.    No gallop.  Pulmonary:     Effort: Pulmonary effort is normal.     Breath sounds: Normal breath sounds.  Abdominal:     General: Bowel sounds are normal.     Palpations: Abdomen is soft.  Musculoskeletal:     Right lower leg: No edema.     Left lower leg: No edema.     Medications and allergies   Allergies  Allergen Reactions   Dexilant [Dexlansoprazole] Diarrhea     Medication list after today's encounter   Current Outpatient Medications:    acetaminophen (TYLENOL 8 HOUR ARTHRITIS PAIN) 650 MG CR tablet, PRN Oral, Disp: , Rfl:    ALPRAZolam (XANAX) 0.5 MG tablet, Take 0.5 mg by mouth as needed for anxiety., Disp: , Rfl:    aspirin 81 MG tablet, Take 81 mg by mouth daily., Disp: , Rfl:    cholecalciferol (VITAMIN D3) 25 MCG (1000 UNIT) tablet, Take 1,000 Units by mouth daily., Disp: , Rfl:    hydrochlorothiazide  (HYDRODIURIL) 12.5 MG tablet, Take 12.5 mg by mouth daily., Disp: , Rfl:    losartan (COZAAR) 100 MG tablet, Take 1 tablet by mouth at bedtime., Disp: , Rfl:    Magnesium 400 MG CAPS, Take 1 capsule by mouth at bedtime., Disp: , Rfl:    [place] 420 MG/3.5ML SOCT, Inject 1 Syringe into the skin every 30 (thirty) days., Disp: , Rfl:   Laboratory examination:   Lab Results  Component Value Date   NA 141 12/03/2021   K 3.7 12/03/2021   CO2 26 08/29/2020   GLUCOSE 112 (H) 12/03/2021   BUN 18 12/03/2021   CREATININE 0.90 12/03/2021   CALCIUM 9.4 08/29/2020   GFRNONAA 54 (L) 08/29/2020       Latest Ref Rng & Units 12/03/2021   10:34 AM 08/29/2020    4:58 AM  CMP  Glucose 70 - 99 mg/dL 112  119   BUN 8 - 23 mg/dL 18  19   Creatinine 0.44 - 1.00 mg/dL 0.90  1.10   Sodium 135 - 145 mmol/L 141  138   Potassium 3.5 - 5.1 mmol/L 3.7  3.5   Chloride 98 - 111 mmol/L 104  104   CO2 22 - 32 mmol/L  26   Calcium 8.9 - 10.3 mg/dL  9.4   Total Protein 6.5 - 8.1 g/dL  7.3   Total Bilirubin 0.3 - 1.2 mg/dL  0.9   Alkaline Phos 38 - 126 U/L  55   AST 15 - 41 U/L  17   ALT 0 - 44 U/L  20       Latest Ref Rng & Units 12/03/2021   10:34 AM 08/29/2020    4:58 AM  CBC  WBC 4.0 - 10.5 K/uL  8.9   Hemoglobin 12.0 - 15.0 g/dL 14.3  13.4   Hematocrit 36.0 - 46.0 % 42.0  39.2   Platelets 150 - 400 K/uL  236     Lipid Panel No results for input(s): "CHOL", "TRIG", "LDLCALC", "VLDL", "HDL", "CHOLHDL", "LDLDIRECT" in the last 8760 hours.  External labs:   Labs 09/14/2021:  Serum glucose 108 mg, BUN 13, creatinine 0.8, EGFR 70 mL, potassium 4.2, LFTs normal.  Hb 14.2/HCT 40.6, platelets 218, normal indicis.  Sedimentation rate 19.  Rheumatoid factor negative.  Vitamin D  26.6.  Labs 04/24/2021:  Total cholesterol 181, triglycerides 104, HDL 74, LDL 86.  Non-HDL cholesterol 107. Apolipoprotein B 74.  TSH normal at 2.53.  Labs 12/30/2017:  Total cholesterol 308, triglycerides  51, HDL 90, LDL 208.  Apolipoprotein B 157.  Radiology:    Cardiac Studies:   Coronary calcium score 02/05/2018 LM 0 LAD 340 LCx 176 RCA 654.  There is plaque in the proximal and acute marginal vessel. Total coronary calcium score 1170.  This places the patient between 90th and 100 percentile for female in this age range suggesting extensive atherosclerotic plaque and high likelihood of at least 1 significant coronary narrowing. Changes suggesting mild COPD and interstitial disease.  Exercise myoview stress 02/23/2018: 1. The resting electrocardiogram demonstrated normal sinus rhythm, incomplete RBBB and no resting arrhythmias.  With peak exercise there was nonspecific less than 1 mm upsloping ST segment depression which is back to baseline immediately into recovery.  This is negative for myocardial ischemia.  Stress was terminated due to fatigue and achieving 87% of MPHR. Normal BP response.  Treadmill exercise using a Bruce protocol, completing 6:14 minutes. And an estimated workload of 7.39 METS 2. Myocardial perfusion imaging is normal. Overall left ventricular systolic function was normal without regional wall motion abnormalities. The left ventricular ejection fraction was 66%.  This is a low risk study.  EKG:   EKG 12/20/2021: Normal sinus rhythm at rate of 72 bpm, normal axis.  Nonspecific T abnormality, cannot exclude high lateral ischemia.  Compared to 01/22/2018, T wave abnormality is new.  Assessment     ICD-10-CM   1. Pre-operative cardiovascular examination, new EKG abnormalities c/w ischemia  Z01.810 PCV MYOCARDIAL PERFUSION WO LEXISCAN   R94.31 PCV ECHOCARDIOGRAM COMPLETE    2. Coronary artery disease involving native coronary artery of native heart without angina pectoris  I25.10 PCV MYOCARDIAL PERFUSION WO LEXISCAN    PCV ECHOCARDIOGRAM COMPLETE    3. Elevated coronary artery calcium score 02/05/2018: Total coronary calcium score 1170.  90th and 100 percentile  MESA  database.  R93.1     4. Primary hypertension  I10 EKG 12-Lead    5. Pure hypercholesterolemia  E78.00        Orders Placed This Encounter  Procedures   PCV MYOCARDIAL PERFUSION WO LEXISCAN    Standing Status:   Future    Standing Expiration Date:   02/20/2022   EKG 12-Lead   PCV ECHOCARDIOGRAM COMPLETE    Standing Status:   Future    Standing Expiration Date:   12/21/2022    No orders of the defined types were placed in this encounter.   Medications Discontinued During This Encounter  Medication Reason   BIOTIN PO      Recommendations:   Madison [name] is a 71 y.o.  Caucasian female with hypertension, hyperlipidemia, probably heterozygous familial hyperlipidemia having markedly elevated lipids for several years and has been on and off statins; however, had muscle pain with 3 different statins. Also had CK elevation with statins. CK level has remained elevated. She was evaluated by Rheumatology for evaluation of myalgia and reports workup was negative.  I had seen her last in 2019 in view of markedly elevated coronary calcium score, made the diagnosis of CAD and placed her on Repatha.  She is tolerating this without side effects.  Although she does not have an exercise regimen, stays active and denies chest pain or dyspnea.  Recently her left leg bunionectomy was canceled due to need for cardiac clearance.  Although  asymptomatic, she clearly has markedly abnormal EKG suggestive of lateral ischemia.  This is a new compared to 2019.  Although LDL and apolipoprotein B levels have improved significantly since being on Repatha suspect she may need addition of Zetia to her therapy.  In view of new EKG abnormalities I have recommended repeating nuclear stress test and also an echocardiogram to be performed.  Blood pressure is elevated, home recordings have been good and normal in the 130 range.  She will continue to monitor this at home until I see her back after the test.  I may consider  addition of a beta-blocker or change losartan and hydrochlorothiazide to Benicar HCT.  Office visit in 4 to 6 weeks.  Patient prefers to wait on surgery for now.   Adrian Prows, MD, [facility] 12/20/2021, 12:29 PM Office: [phone]

## 2022-01-10 DIAGNOSIS — S60462A Insect bite (nonvenomous) of right middle finger, initial encounter: Secondary | ICD-10-CM | POA: Diagnosis not present

## 2022-01-10 DIAGNOSIS — T63444A Toxic effect of venom of bees, undetermined, initial encounter: Secondary | ICD-10-CM | POA: Diagnosis not present

## 2022-01-15 ENCOUNTER — Ambulatory Visit: Payer: Medicare Other

## 2022-01-15 DIAGNOSIS — R9431 Abnormal electrocardiogram [ECG] [EKG]: Secondary | ICD-10-CM | POA: Diagnosis not present

## 2022-01-15 DIAGNOSIS — I2510 Atherosclerotic heart disease of native coronary artery without angina pectoris: Secondary | ICD-10-CM | POA: Diagnosis not present

## 2022-01-15 DIAGNOSIS — Z01810 Encounter for preprocedural cardiovascular examination: Secondary | ICD-10-CM | POA: Diagnosis not present

## 2022-01-24 ENCOUNTER — Ambulatory Visit: Payer: Medicare Other

## 2022-01-24 DIAGNOSIS — R9431 Abnormal electrocardiogram [ECG] [EKG]: Secondary | ICD-10-CM

## 2022-01-24 DIAGNOSIS — Z01810 Encounter for preprocedural cardiovascular examination: Secondary | ICD-10-CM | POA: Diagnosis not present

## 2022-01-24 DIAGNOSIS — I2510 Atherosclerotic heart disease of native coronary artery without angina pectoris: Secondary | ICD-10-CM

## 2022-01-26 NOTE — Progress Notes (Signed)
Normal echocardiogram with mild abnormality

## 2022-01-30 NOTE — Progress Notes (Signed)
Called and spoke with patient regarding her echocardiogram results.

## 2022-02-04 ENCOUNTER — Encounter: Payer: Self-pay | Admitting: Cardiology

## 2022-02-04 ENCOUNTER — Ambulatory Visit: Payer: Medicare Other | Admitting: Cardiology

## 2022-02-04 VITALS — BP 151/87 | HR 62 | Resp 16 | Ht 64.0 in | Wt 163.0 lb

## 2022-02-04 DIAGNOSIS — E7800 Pure hypercholesterolemia, unspecified: Secondary | ICD-10-CM | POA: Diagnosis not present

## 2022-02-04 DIAGNOSIS — I2510 Atherosclerotic heart disease of native coronary artery without angina pectoris: Secondary | ICD-10-CM | POA: Diagnosis not present

## 2022-02-04 DIAGNOSIS — I10 Essential (primary) hypertension: Secondary | ICD-10-CM | POA: Diagnosis not present

## 2022-02-04 DIAGNOSIS — R931 Abnormal findings on diagnostic imaging of heart and coronary circulation: Secondary | ICD-10-CM

## 2022-02-04 MED ORDER — OLMESARTAN MEDOXOMIL-HCTZ 40-25 MG PO TABS: 1.0000 | ORAL_TABLET | Freq: Every day | ORAL | 3 refills | Status: DC

## 2022-02-04 NOTE — Progress Notes (Signed)
Primary Physician/Referring:  Velna Hatchet, MD  Patient ID: Madison [name], female    DOB: 1950-08-23, 71 y.o.   MRN: [id number]  Chief Complaint  Patient presents with   Results   Follow-up   HPI:    Madison [name]  is a 71 y.o. Caucasian female with hypertension, hyperlipidemia, probably heterozygous familial hyperlipidemia having markedly elevated lipids for several years and has been on and off statins due to severe myalgias, myopathy with elevated CK enzymes.  Due to coronary calcium score in the 90th to 100 percentile for age, she is presently on Repatha which she is tolerating.  She remains asymptomatic, states that she has been exercising regularly.  She was scheduled for left bunionectomy which was postponed due to abnormal EKG.  Past Medical History:  Diagnosis Date   Anxiety    Arthritis    Colon polyps    Diverticulosis    Hx of diverticulitis of colon 08/2020   Hyperlipidemia    Hypertension    Liver cyst 08/26/2015   per ultrasound   Pneumonia    Past Surgical History:  Procedure Laterality Date   BUNIONECTOMY Right    COLONOSCOPY     HEMORROIDECTOMY     POLYPECTOMY     UPPER GASTROINTESTINAL ENDOSCOPY  10/11/2010   US ABDOMEN AND PELVIS  (ARMX HX)       Social History   Tobacco Use   Smoking status: Former    Packs/day: 1.00    Years: 20.00    Total pack years: 20.00    Types: Cigarettes    Quit date: 1991    Years since quitting: 32.7   Smokeless tobacco: Never  Substance Use Topics   Alcohol use: Yes    Alcohol/week: 14.0 standard drinks of alcohol    Types: 14 Standard drinks or equivalent per week    Comment: 2 per day   Marital Status: Married   ROS  Review of Systems  Cardiovascular:  Negative for chest pain, dyspnea on exertion and leg swelling.   Objective  Blood pressure (!) 151/87, pulse 62, resp. rate 16, height '5\' 4"'$  (1.626 m), weight 163 lb (73.9 kg), SpO2 98 %. Body mass index is 27.98 kg/m.     02/04/2022    1:32 PM 02/04/2022     1:29 PM 12/20/2021   11:26 AM  Vitals with BMI  Height  '5\' 4"'$    Weight  163 lbs   BMI  29.93   Systolic [phone]  Diastolic 87 86 83  Pulse 62 63 79    Physical Exam Neck:     Vascular: No JVD.  Cardiovascular:     Rate and Rhythm: Normal rate and regular rhythm.     Pulses: Intact distal pulses.     Heart sounds: Normal heart sounds. No murmur heard.    No gallop.  Pulmonary:     Effort: Pulmonary effort is normal.     Breath sounds: Normal breath sounds.  Abdominal:     General: Bowel sounds are normal.     Palpations: Abdomen is soft.  Musculoskeletal:     Right lower leg: No edema.     Left lower leg: No edema.    Medications and allergies   Allergies  Allergen Reactions   Dexilant [Dexlansoprazole] Diarrhea    Medication list after today's encounter   Current Outpatient Medications:    acetaminophen (TYLENOL 8 HOUR ARTHRITIS PAIN) 650 MG CR tablet, PRN Oral, Disp: , Rfl:    ALPRAZolam Duanne Moron)  0.5 MG tablet, Take 0.5 mg by mouth as needed for anxiety., Disp: , Rfl:    aspirin 81 MG tablet, Take 81 mg by mouth daily., Disp: , Rfl:    cholecalciferol (VITAMIN D3) 25 MCG (1000 UNIT) tablet, Take 1,000 Units by mouth daily., Disp: , Rfl:    olmesartan-hydrochlorothiazide (BENICAR HCT) 40-25 MG tablet, Take 1 tablet by mouth daily., Disp: 90 tablet, Rfl: 3   Lovelady 420 MG/3.5ML SOCT, Inject 1 Syringe into the skin every 30 (thirty) days., Disp: , Rfl:   Laboratory examination:   Lab Results  Component Value Date   NA 141 12/03/2021   K 3.7 12/03/2021   CO2 26 08/29/2020   GLUCOSE 112 (H) 12/03/2021   BUN 18 12/03/2021   CREATININE 0.90 12/03/2021   CALCIUM 9.4 08/29/2020   GFRNONAA 54 (L) 08/29/2020       Latest Ref Rng & Units 12/03/2021   10:34 AM 08/29/2020    4:58 AM  CMP  Glucose 70 - 99 mg/dL 112  119   BUN 8 - 23 mg/dL 18  19   Creatinine 0.44 - 1.00 mg/dL 0.90  1.10   Sodium 135 - 145 mmol/L 141  138   Potassium 3.5 -  5.1 mmol/L 3.7  3.5   Chloride 98 - 111 mmol/L 104  104   CO2 22 - 32 mmol/L  26   Calcium 8.9 - 10.3 mg/dL  9.4   Total Protein 6.5 - 8.1 g/dL  7.3   Total Bilirubin 0.3 - 1.2 mg/dL  0.9   Alkaline Phos 38 - 126 U/L  55   AST 15 - 41 U/L  17   ALT 0 - 44 U/L  20       Latest Ref Rng & Units 12/03/2021   10:34 AM 08/29/2020    4:58 AM  CBC  WBC 4.0 - 10.5 K/uL  8.9   Hemoglobin 12.0 - 15.0 g/dL 14.3  13.4   Hematocrit 36.0 - 46.0 % 42.0  39.2   Platelets 150 - 400 K/uL  236     External labs:   Labs 09/14/2021:  Serum glucose 108 mg, BUN 13, creatinine 0.8, EGFR 70 mL, potassium 4.2, LFTs normal.  Hb 14.2/HCT 40.6, platelets 218, normal indicis.  Sedimentation rate 19.  Rheumatoid factor negative.  Vitamin D 26.6.  Labs 04/24/2021:  Total cholesterol 181, triglycerides 104, HDL 74, LDL 86.  Non-HDL cholesterol 107. Apolipoprotein B 74.  TSH normal at 2.53.  Labs 12/30/2017:  Total cholesterol 308, triglycerides 51, HDL 90, LDL 208.  Apolipoprotein B 157.  Radiology:    Cardiac Studies:   Coronary calcium score 02/05/2018 LM 0 LAD 340 LCx 176 RCA 654.  There is plaque in the proximal and acute marginal vessel. Total coronary calcium score 1170.  This places the patient between 90th and 100 percentile for female in this age range suggesting extensive atherosclerotic plaque and high likelihood of at least 1 significant coronary narrowing. Changes suggesting mild COPD and interstitial disease.  PCV MYOCARDIAL PERFUSION WO LEXISCAN 01/15/2022  Narrative Exercise nuclear stress test 01/15/22 Myocardial perfusion is normal. Low risk study. Overall LV systolic function is normal without regional wall motion abnormalities. Stress LV EF: 70%. Normal ECG stress. The patient exercised for 6 minutes and 19 seconds of a Modified Bruce protocol, achieving approximately 7.52 METs and 88% MPHR. The heart rate response was normal. The blood pressure response was  physiologic. Compared to 02/23/2018, no change.  PCV ECHOCARDIOGRAM  COMPLETE 01/24/2022  Narrative Echocardiogram 01/24/2022: Normal LV systolic function with visual EF 55-60%. Left ventricle cavity is normal in size. Normal left ventricular wall thickness. Normal global wall motion. Doppler evidence of grade I (impaired) diastolic dysfunction, normal LAP. Calculated EF 58%. Structurally normal tricuspid valve with trace regurgitation. No evidence of pulmonary hypertension. No prior available for comparison.    EKG:   EKG 12/20/2021: Normal sinus rhythm at rate of 72 bpm, normal axis.  Nonspecific T abnormality, cannot exclude high lateral ischemia.  Compared to 01/22/2018, T wave abnormality is new.  Assessment     ICD-10-CM   1. Coronary artery disease involving native coronary artery of native heart without angina pectoris  I25.10     2. Elevated coronary artery calcium score 02/05/2018: Total coronary calcium score 1170.  90th and 100 percentile  MESA database.  R93.1     3. Primary hypertension  I10 olmesartan-hydrochlorothiazide (BENICAR HCT) 40-25 MG tablet    4. Pure hypercholesterolemia  E78.00      No orders of the defined types were placed in this encounter.  Meds ordered this encounter  Medications   olmesartan-hydrochlorothiazide (BENICAR HCT) 40-25 MG tablet    Sig: Take 1 tablet by mouth daily.    Dispense:  90 tablet    Refill:  3    Please discontinue losartan and hydrochlorothiazide   Medications Discontinued During This Encounter  Medication Reason   Magnesium 400 MG CAPS    losartan (COZAAR) 100 MG tablet Change in therapy   hydrochlorothiazide (HYDRODIURIL) 12.5 MG tablet Change in therapy     Recommendations:   Madison [name] is a 71 y.o.  Caucasian female with hypertension, hyperlipidemia, probably heterozygous familial hyperlipidemia having markedly elevated lipids for several years and has been on and off statins due to severe myalgias, myopathy with  elevated CK enzymes.  Due to coronary calcium score in the 90th to 100 percentile for age, she is presently on Repatha which she is tolerating.  She was scheduled for left leg bunionectomy which was canceled due to abnormal EKG.  After evaluation I performed exercise nuclear stress test and also echocardiogram.  She now presents for follow-up.  Suspect in view of normal nuclear stress test, EKG abnormalities due to uncontrolled hypertension.  I have discontinued losartan and also HCTZ 12.5 mg, we will switch her to Benicar HCT 40/25 mg in the morning.  She needs lipid profile testing, her last lipid profile was 04/24/2021 and LDL was not at goal.  She has an appointment for complete physical with Dr. Ardeth Perfect soon, would add Zetia 10 mg daily if LDL not <70.  Reviewed the results of the stress test and echocardiogram.  Fortunately in spite of markedly elevated coronary calcium score, no evidence of ischemia both by stress EKG and also by nuclear perfusion.  Mild diastolic dysfunction discussed with the patient.  Gradually increase her physical activity as tolerated.  Patient was scheduled for left leg left leg bunionectomy, however she has any trip planned in January.  Hence does not want to go to surgery.  Overall low risk for surgery if she ever decides to get this done.    Madison Prows, MD, [facility] 02/04/2022, 2:12 PM Office: [phone]

## 2022-02-23 IMAGING — US US BREAST*L* LIMITED INC AXILLA
1 series · 13 of 15 positions shown · non-contrast
Comparison: Previous exams including recent screening mammogram
dated 06/27/2021.

CLINICAL DATA: Patient returns today to evaluate a possible LEFT
breast asymmetry questioned on recent screening mammogram.

EXAM:
DIGITAL DIAGNOSTIC UNILATERAL LEFT MAMMOGRAM WITH TOMOSYNTHESIS AND
CAD; ULTRASOUND LEFT BREAST LIMITED
TECHNIQUE: Left digital diagnostic mammography and breast tomosynthesis was
performed. The images were evaluated with computer-aided detection.;
Targeted ultrasound examination of the left breast was performed.

[Series 1: us breast*left* limited inc axilla · 0.07mm/px · 13 of 15 slices shown]
[im 1/15]
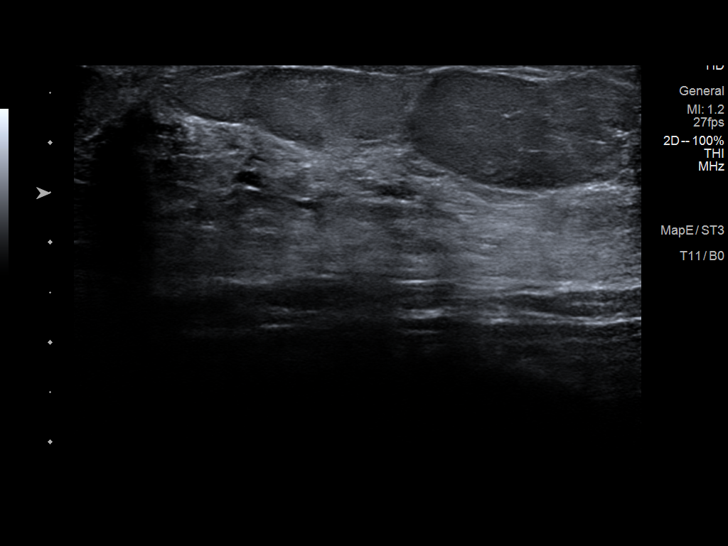
[im 2/15]
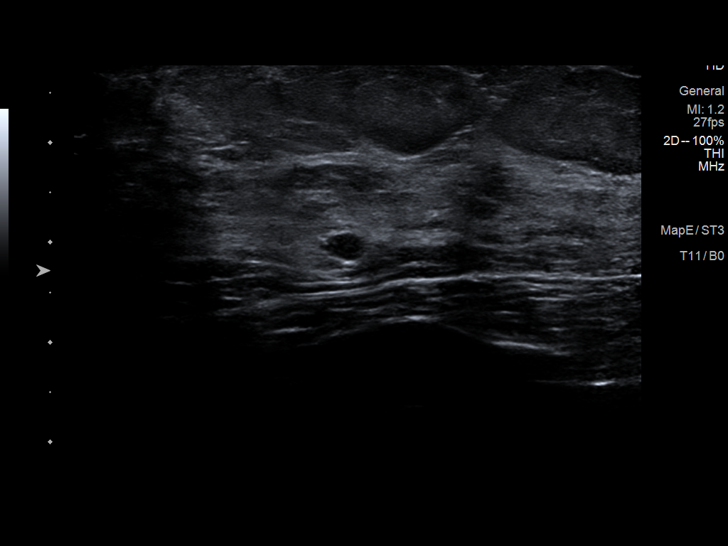
[im 3/15]
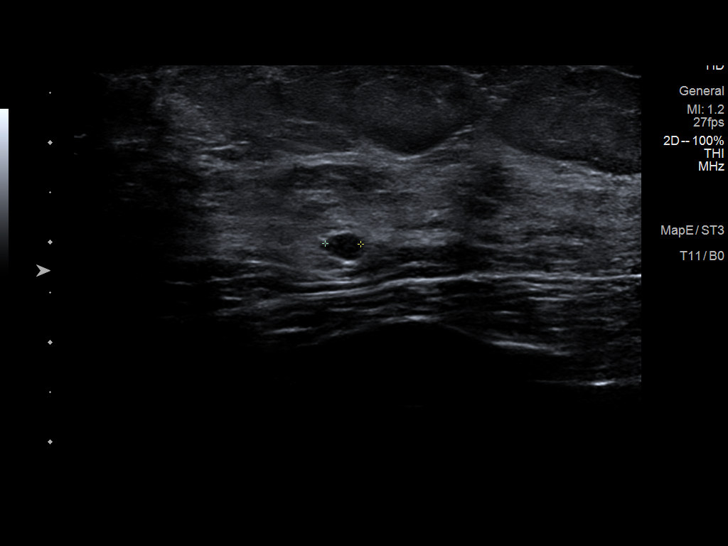
[im 5/15]
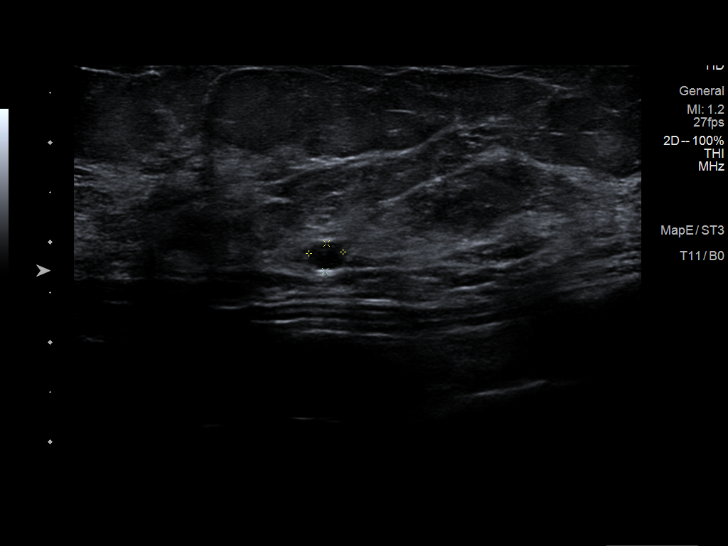
[im 6/15]
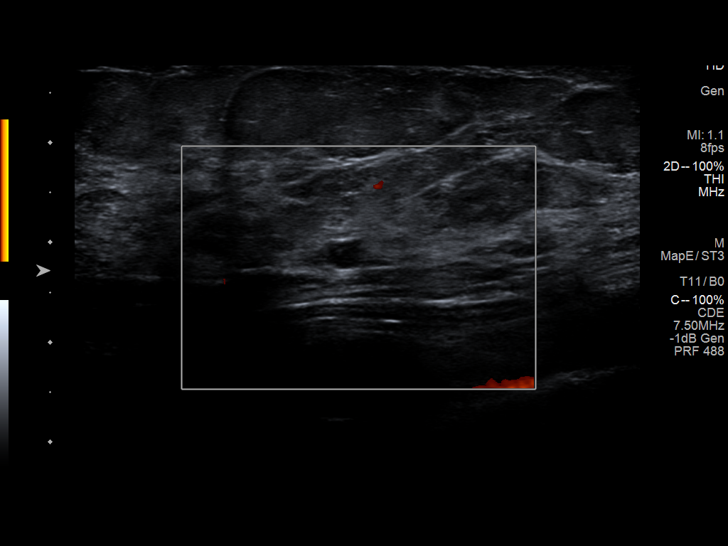
[im 7/15]
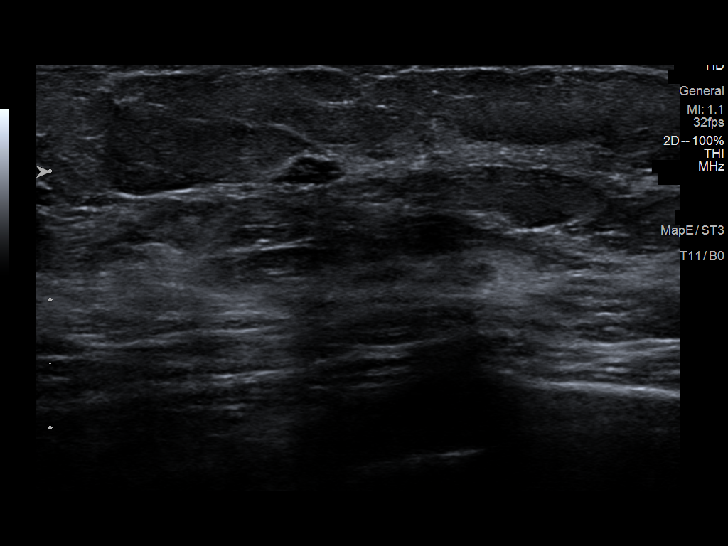
[im 8/15]
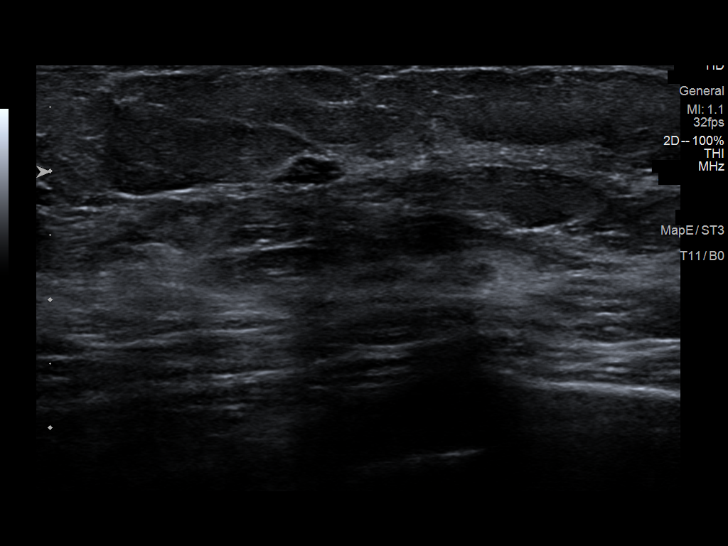
[im 9/15]
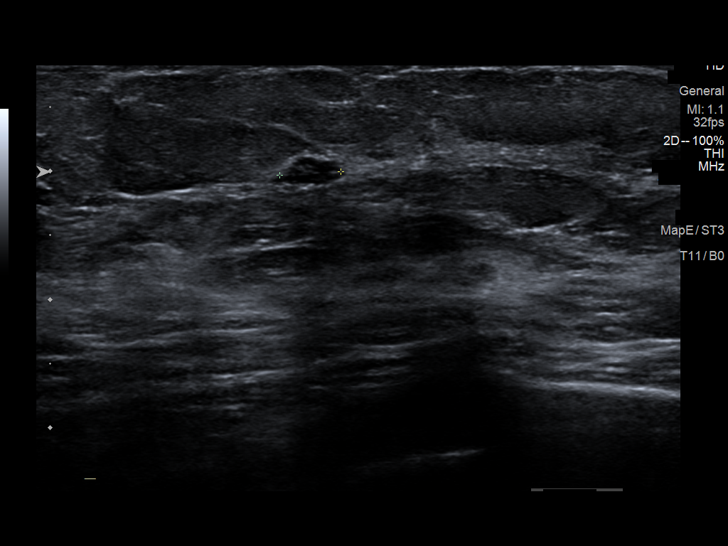
[im 10/15]
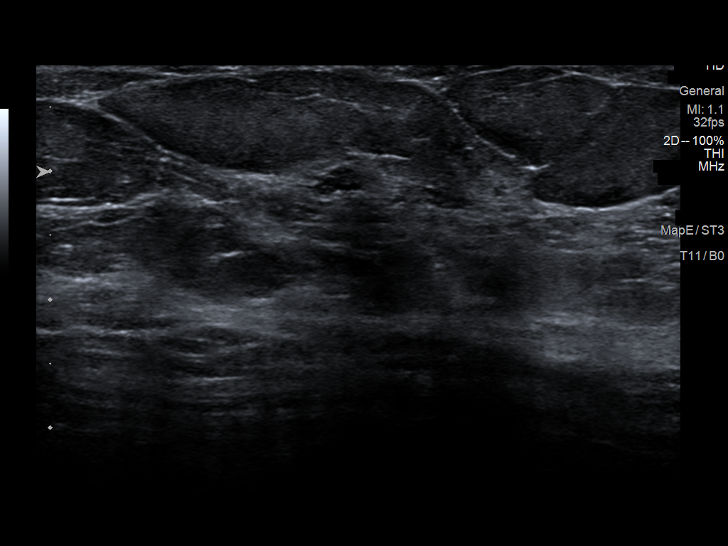
[im 11/15]
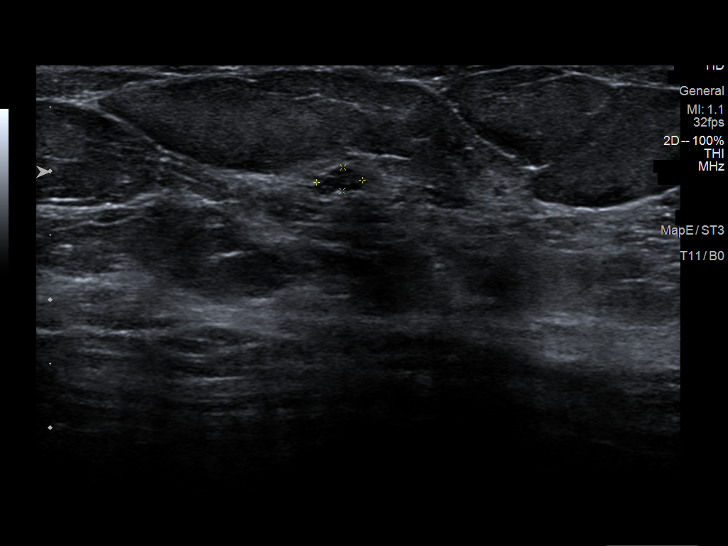
[im 13/15]
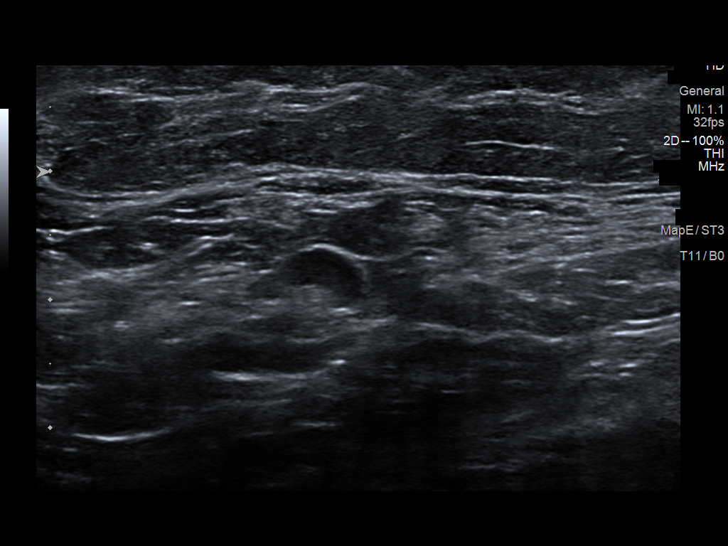
[im 14/15]
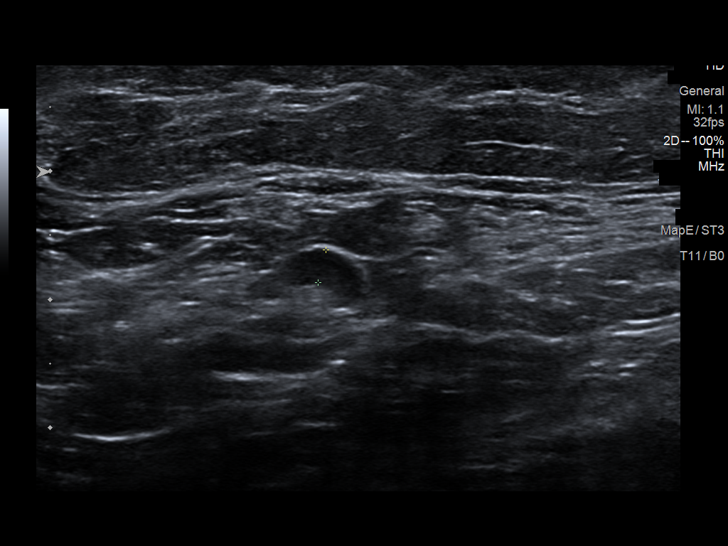
[im 15/15]
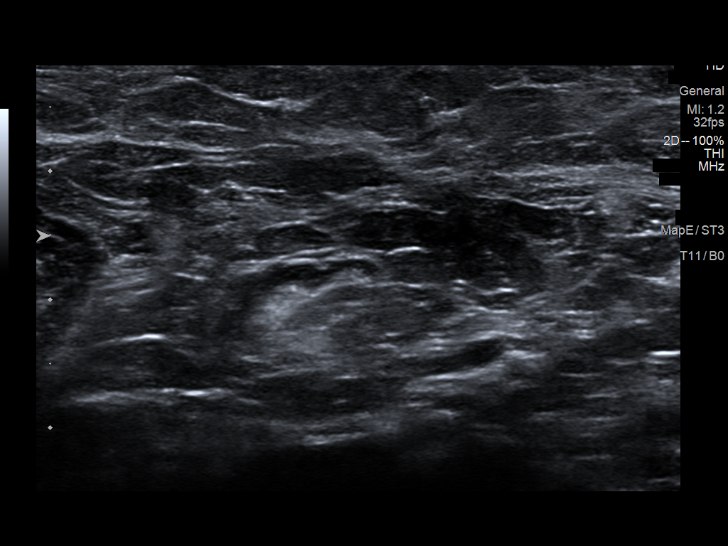

[13 of 15 positions shown; findings below may reference images not displayed]

ACR Breast Density Category c: The breast tissue is heterogeneously
dense, which may obscure small masses.
FINDINGS: On today's additional diagnostic views with spot compression with 3D
tomosynthesis, a partially obscured low-density mass is identified
within the outer LEFT breast, 2-4 o'clock axis region, measuring
approximately 5 mm greatest dimension.

Targeted ultrasound is performed, showing multiple small cysts
within the outer LEFT breast, 2-4 o'clock axis, largest measuring 5
mm corresponding to the mammographic findings. No suspicious
findings by ultrasound.
IMPRESSION: No evidence of malignancy. Multiple small cysts within the outer
LEFT breast, largest measuring 5 mm, corresponding to the
mammographic findings.

Patient may return to routine annual bilateral screening mammogram
schedule.

RECOMMENDATION:
Screening mammogram in one year.(Code:J6-U-M8S)

I have discussed the findings and recommendations with the patient.
If applicable, a reminder letter will be sent to the patient
regarding the next appointment.

BI-RADS CATEGORY  2: Benign.

## 2022-02-23 IMAGING — MG MM DIGITAL DIAGNOSTIC UNILAT*L* W/ TOMO W/ CAD
4 series · 4 of 12 positions shown · non-contrast
Comparison: Previous exams including recent screening mammogram
dated 06/27/2021.

CLINICAL DATA: Patient returns today to evaluate a possible LEFT
breast asymmetry questioned on recent screening mammogram.

EXAM:
DIGITAL DIAGNOSTIC UNILATERAL LEFT MAMMOGRAM WITH TOMOSYNTHESIS AND
CAD; ULTRASOUND LEFT BREAST LIMITED
TECHNIQUE: Left digital diagnostic mammography and breast tomosynthesis was
performed. The images were evaluated with computer-aided detection.;
Targeted ultrasound examination of the left breast was performed.

[L MLO synth-2D]
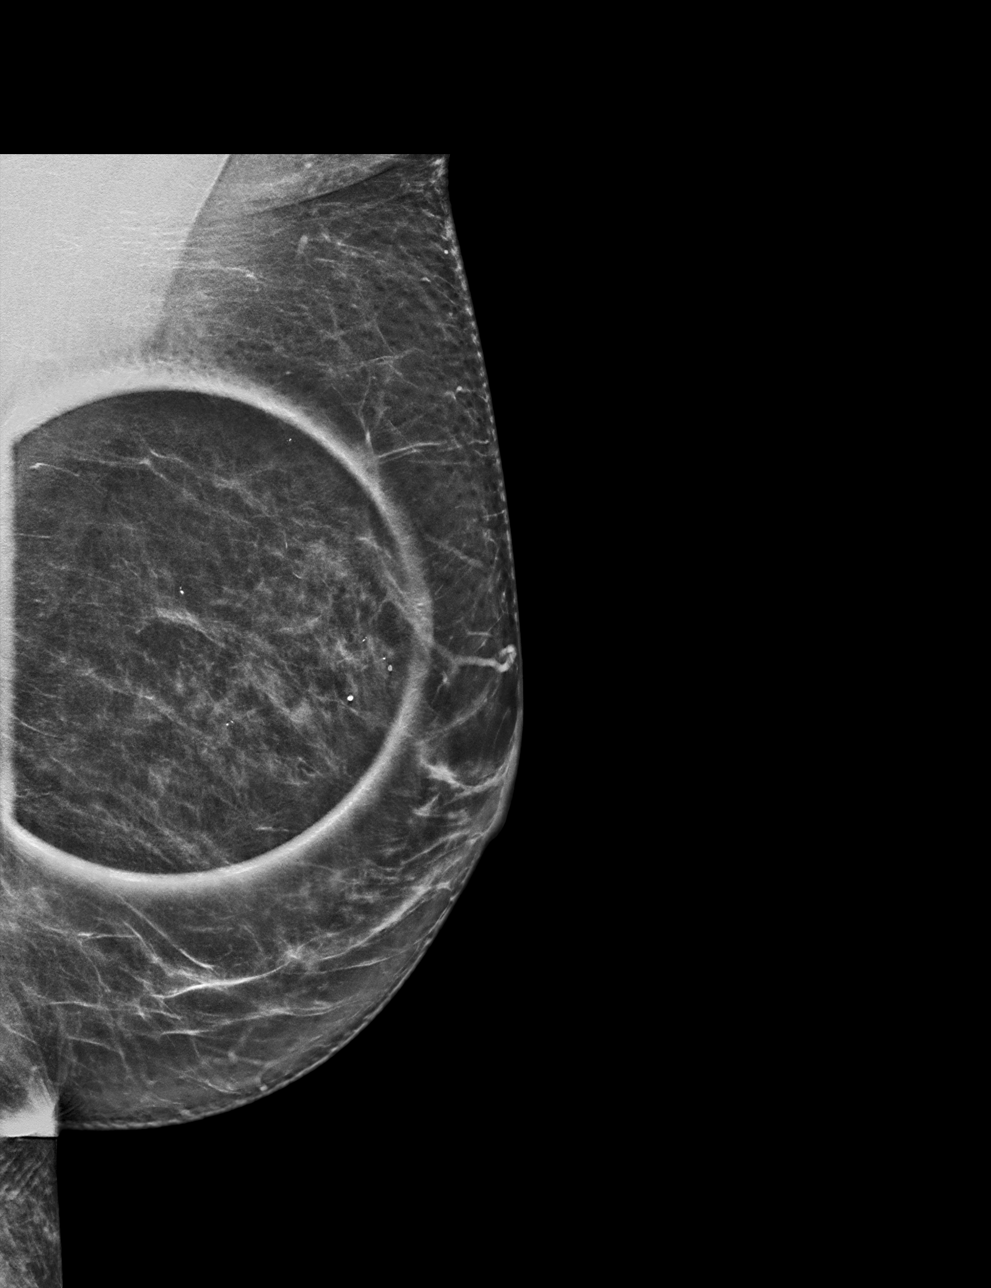

[L CC synth-2D]
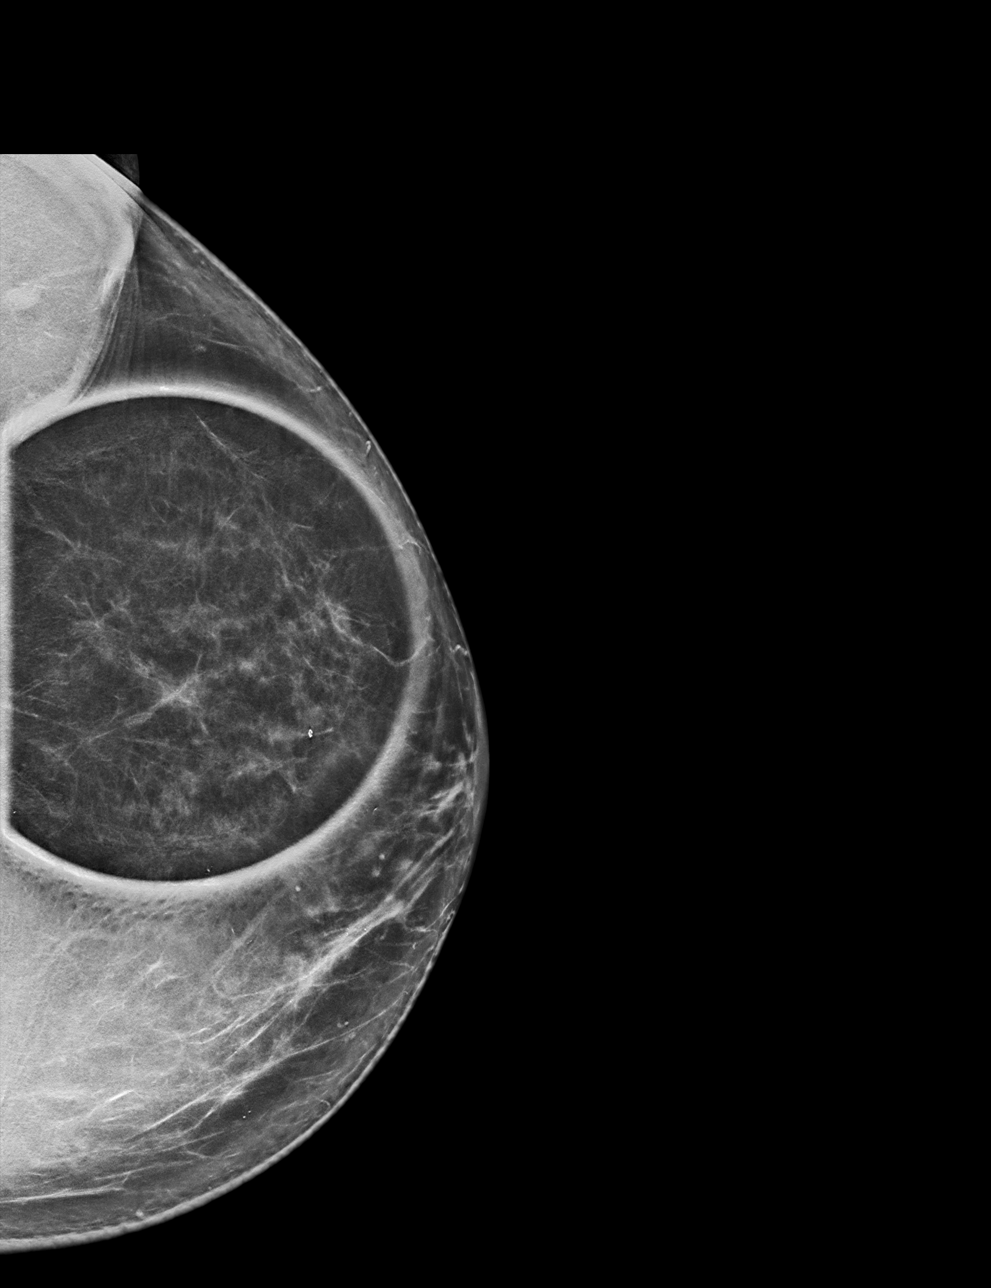

[L CC tomo · tomo slice 37/74.0]
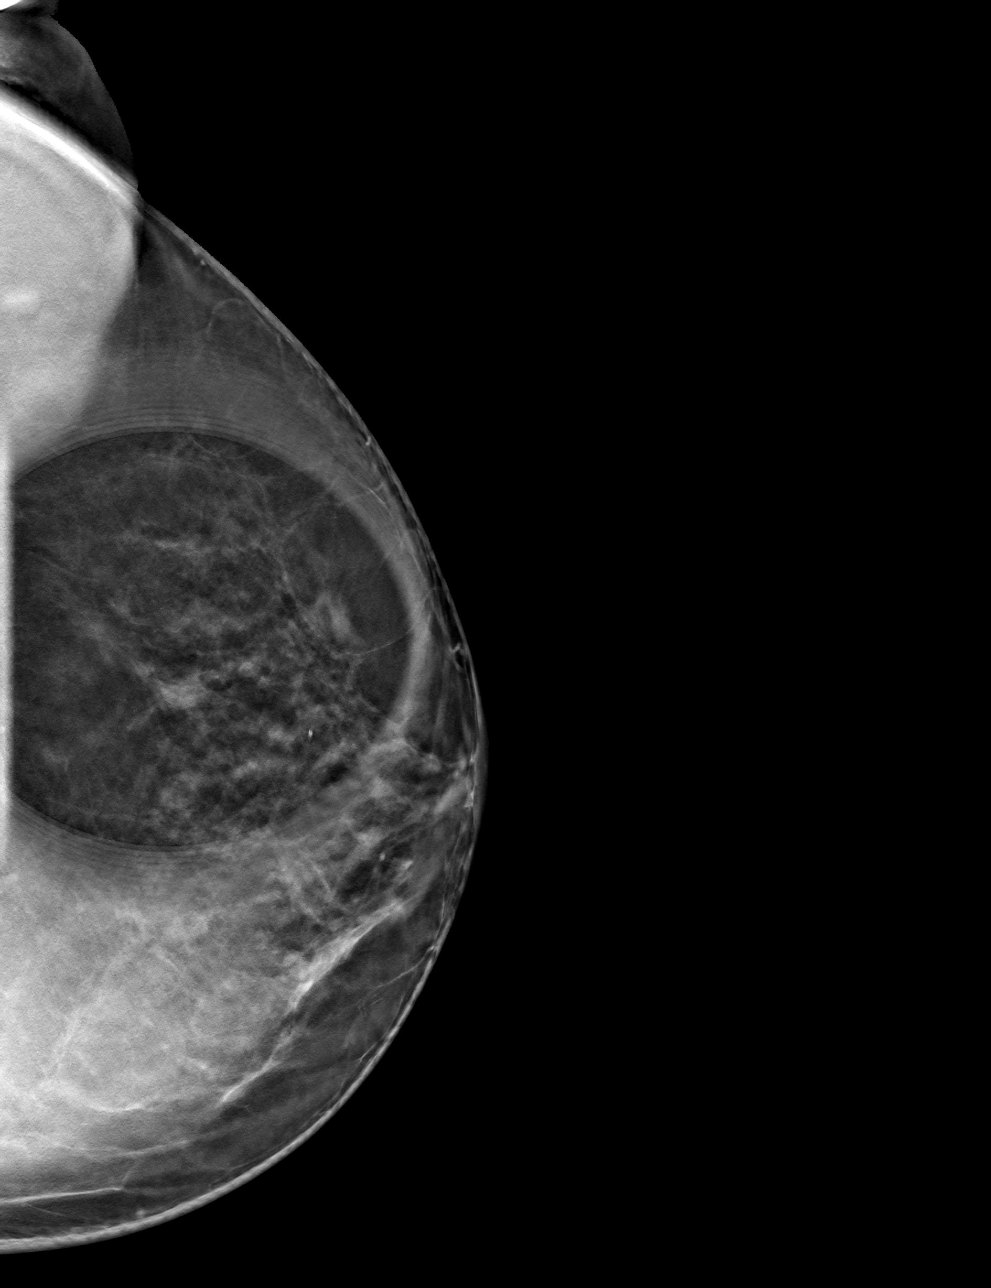

[L MLO tomo · tomo slice 37/72.0]
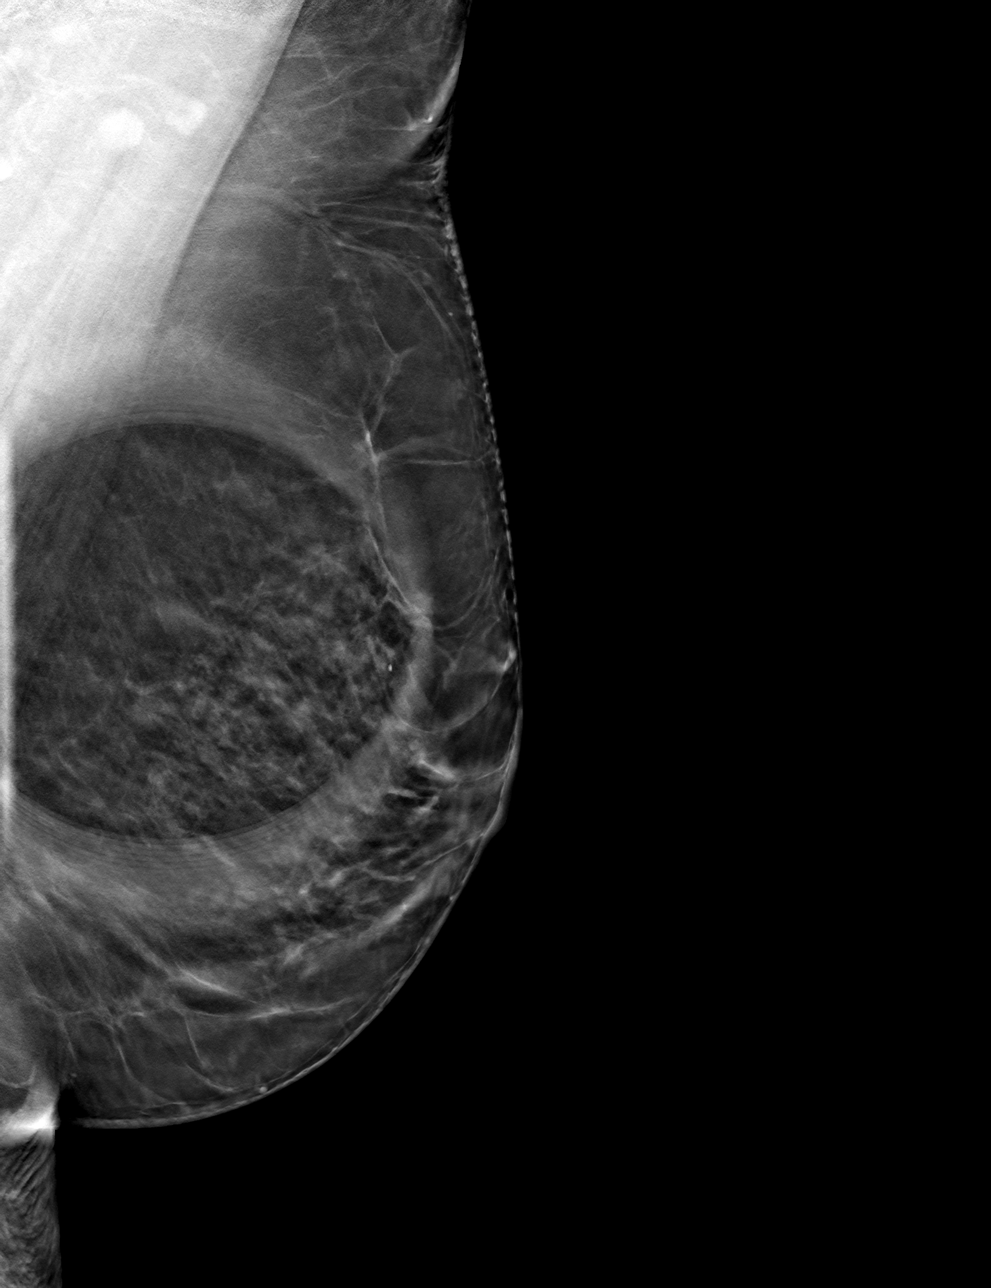

[4 of 12 positions shown; findings below may reference images not displayed]

ACR Breast Density Category c: The breast tissue is heterogeneously
dense, which may obscure small masses.
FINDINGS: On today's additional diagnostic views with spot compression with 3D
tomosynthesis, a partially obscured low-density mass is identified
within the outer LEFT breast, 2-4 o'clock axis region, measuring
approximately 5 mm greatest dimension.

Targeted ultrasound is performed, showing multiple small cysts
within the outer LEFT breast, 2-4 o'clock axis, largest measuring 5
mm corresponding to the mammographic findings. No suspicious
findings by ultrasound.
IMPRESSION: No evidence of malignancy. Multiple small cysts within the outer
LEFT breast, largest measuring 5 mm, corresponding to the
mammographic findings.

Patient may return to routine annual bilateral screening mammogram
schedule.

RECOMMENDATION:
Screening mammogram in one year.(Code:J6-U-M8S)

I have discussed the findings and recommendations with the patient.
If applicable, a reminder letter will be sent to the patient
regarding the next appointment.

BI-RADS CATEGORY  2: Benign.

## 2022-03-02 DIAGNOSIS — Z23 Encounter for immunization: Secondary | ICD-10-CM | POA: Diagnosis not present

## 2022-03-08 DIAGNOSIS — H2511 Age-related nuclear cataract, right eye: Secondary | ICD-10-CM | POA: Diagnosis not present

## 2022-03-08 DIAGNOSIS — H35372 Puckering of macula, left eye: Secondary | ICD-10-CM | POA: Diagnosis not present

## 2022-03-08 DIAGNOSIS — H524 Presbyopia: Secondary | ICD-10-CM | POA: Diagnosis not present

## 2022-05-06 DIAGNOSIS — R7309 Other abnormal glucose: Secondary | ICD-10-CM | POA: Diagnosis not present

## 2022-05-06 DIAGNOSIS — I10 Essential (primary) hypertension: Secondary | ICD-10-CM | POA: Diagnosis not present

## 2022-05-06 DIAGNOSIS — E785 Hyperlipidemia, unspecified: Secondary | ICD-10-CM | POA: Diagnosis not present

## 2022-05-06 DIAGNOSIS — R7989 Other specified abnormal findings of blood chemistry: Secondary | ICD-10-CM | POA: Diagnosis not present

## 2022-05-06 DIAGNOSIS — E559 Vitamin D deficiency, unspecified: Secondary | ICD-10-CM | POA: Diagnosis not present

## 2022-05-13 DIAGNOSIS — F418 Other specified anxiety disorders: Secondary | ICD-10-CM | POA: Diagnosis not present

## 2022-05-13 DIAGNOSIS — M21619 Bunion of unspecified foot: Secondary | ICD-10-CM | POA: Diagnosis not present

## 2022-05-13 DIAGNOSIS — I10 Essential (primary) hypertension: Secondary | ICD-10-CM | POA: Diagnosis not present

## 2022-05-13 DIAGNOSIS — R82998 Other abnormal findings in urine: Secondary | ICD-10-CM | POA: Diagnosis not present

## 2022-05-13 DIAGNOSIS — M205X2 Other deformities of toe(s) (acquired), left foot: Secondary | ICD-10-CM | POA: Diagnosis not present

## 2022-05-13 DIAGNOSIS — Z1339 Encounter for screening examination for other mental health and behavioral disorders: Secondary | ICD-10-CM | POA: Diagnosis not present

## 2022-05-13 DIAGNOSIS — I2584 Coronary atherosclerosis due to calcified coronary lesion: Secondary | ICD-10-CM | POA: Diagnosis not present

## 2022-05-13 DIAGNOSIS — Z0000 Encounter for general adult medical examination without abnormal findings: Secondary | ICD-10-CM | POA: Diagnosis not present

## 2022-05-13 DIAGNOSIS — Z1331 Encounter for screening for depression: Secondary | ICD-10-CM | POA: Diagnosis not present

## 2022-05-13 DIAGNOSIS — M791 Myalgia, unspecified site: Secondary | ICD-10-CM | POA: Diagnosis not present

## 2022-05-13 DIAGNOSIS — E785 Hyperlipidemia, unspecified: Secondary | ICD-10-CM | POA: Diagnosis not present

## 2022-05-13 DIAGNOSIS — K219 Gastro-esophageal reflux disease without esophagitis: Secondary | ICD-10-CM | POA: Diagnosis not present

## 2022-05-17 ENCOUNTER — Encounter: Payer: Self-pay | Admitting: Cardiology

## 2022-05-18 NOTE — Progress Notes (Signed)
Labs 05/06/2022:   Serum glucose 126 mg, creatinine 0.8, BUN 13.  EGFR >70 mL, potassium 4.5.  Hb 13.8/HCT 41.2, platelets 240.  Total cholesterol 182, triglycerides 189, HDL 75, LDL 69.  TSH normal at 2.41.  1C 5.5%.

## 2022-07-24 DIAGNOSIS — Z1231 Encounter for screening mammogram for malignant neoplasm of breast: Secondary | ICD-10-CM | POA: Diagnosis not present

## 2022-07-24 DIAGNOSIS — Z01419 Encounter for gynecological examination (general) (routine) without abnormal findings: Secondary | ICD-10-CM | POA: Diagnosis not present

## 2022-07-24 DIAGNOSIS — Z6828 Body mass index (BMI) 28.0-28.9, adult: Secondary | ICD-10-CM | POA: Diagnosis not present

## 2022-09-03 ENCOUNTER — Telehealth: Payer: Self-pay

## 2022-09-03 NOTE — Telephone Encounter (Signed)
Patient called to inform us that her bp  has been low. Patient mention she does not feel like her self. Patient mention she think she needs her bp medication to be adjusted. This morning without medication her bp was 112/67. Please advise.

## 2022-09-04 NOTE — Telephone Encounter (Signed)
Called patient to inform her to cut the Benicar HCT 40/12.5 mg to 1/2 tab daily. Patient mention she had not been taking it for the past 2 days and it has been around 110-120/50-60. Patien states she feels better. Patient wants to know should she still be one her Benicar HCT due that her bp reading has been normal.  

## 2022-09-04 NOTE — Telephone Encounter (Signed)
Sure she can hold. BP was very high in our office and if she notices BP is trending up better to start sooner the same meds at 1/2 tab

## 2022-09-04 NOTE — Telephone Encounter (Signed)
Called patient to inform her to cut the Benicar HCT 40/12.5 mg to 1/2 tab daily. Patient mention she had not been taking it for the past 2 days and it has been around 110-120/50-60. Patien states she feels better. Patient wants to know should she still be one her Benicar HCT due that her bp reading has been normal.

## 2022-09-04 NOTE — Telephone Encounter (Signed)
Ask her to cut the Benicar HCT 40/12.5 mg to 1/2 tab daily and let us know in a few days

## 2022-09-04 NOTE — Telephone Encounter (Signed)
Called patient to inform her about the message above patient understood

## 2023-02-04 ENCOUNTER — Other Ambulatory Visit: Payer: Self-pay | Admitting: Cardiology

## 2023-02-04 DIAGNOSIS — I10 Essential (primary) hypertension: Secondary | ICD-10-CM

## 2023-02-05 ENCOUNTER — Encounter: Payer: Self-pay | Admitting: Cardiology

## 2023-02-05 ENCOUNTER — Other Ambulatory Visit (HOSPITAL_COMMUNITY): Payer: Self-pay

## 2023-02-05 ENCOUNTER — Ambulatory Visit: Payer: Medicare Other | Admitting: Cardiology

## 2023-02-05 VITALS — BP 117/71 | HR 69 | Resp 16 | Ht 64.0 in | Wt 160.0 lb

## 2023-02-05 DIAGNOSIS — E7800 Pure hypercholesterolemia, unspecified: Secondary | ICD-10-CM

## 2023-02-05 DIAGNOSIS — I10 Essential (primary) hypertension: Secondary | ICD-10-CM

## 2023-02-05 DIAGNOSIS — R931 Abnormal findings on diagnostic imaging of heart and coronary circulation: Secondary | ICD-10-CM | POA: Diagnosis not present

## 2023-02-05 DIAGNOSIS — G720 Drug-induced myopathy: Secondary | ICD-10-CM

## 2023-02-05 DIAGNOSIS — T466X5A Adverse effect of antihyperlipidemic and antiarteriosclerotic drugs, initial encounter: Secondary | ICD-10-CM | POA: Diagnosis not present

## 2023-02-05 MED ORDER — REPATHA SURECLICK 140 MG/ML ~~LOC~~ SOAJ
1.0000 mL | SUBCUTANEOUS | 3 refills | Status: DC
  Filled 2023-02-10: qty 2, 28d supply, fill #0
  Filled 2023-03-17: qty 2, 28d supply, fill #1
  Filled 2023-04-21: qty 2, 28d supply, fill #2
  Filled 2023-05-29: qty 2, 28d supply, fill #3
  Filled 2023-06-22: qty 2, 28d supply, fill #4
  Filled 2023-07-22: qty 2, 28d supply, fill #5
  Filled 2023-08-18: qty 2, 28d supply, fill #6
  Filled 2023-09-15: qty 2, 28d supply, fill #7
  Filled 2023-10-15: qty 2, 28d supply, fill #8
  Filled 2023-11-15: qty 2, 28d supply, fill #9
  Filled 2023-12-21: qty 2, 28d supply, fill #10
  Filled 2024-01-17: qty 2, 28d supply, fill #11

## 2023-02-05 MED ORDER — REPATHA PUSHTRONEX SYSTEM 420 MG/3.5ML ~~LOC~~ SOCT
1.0000 | SUBCUTANEOUS | 3 refills | Status: DC
  Filled 2023-02-05: qty 10.5, fill #0

## 2023-02-05 NOTE — Addendum Note (Signed)
Addended by: Delrae Rend on: 02/05/2023 02:35 PM   Modules accepted: Orders

## 2023-02-05 NOTE — Progress Notes (Addendum)
Primary Physician/Referring:  Alysia Penna, MD  Patient ID: Madison [name], female    DOB: 1950-08-19, 72 y.o.   MRN: [id number]  Chief Complaint  Patient presents with   Hyperlipidemia   elevated coronary calcium score   HPI:    Madison [name]  is a 72 y.o. Caucasian female with hypertension, hyperlipidemia, probably heterozygous familial hyperlipidemia having markedly elevated lipids for several years and has been on and off statins due to severe myalgias, myopathy with elevated CK enzymes.  Due to coronary calcium score in the 90th to 100 percentile for age, she is presently on Repatha which she is tolerating.  She remains asymptomatic, states that she has been exercising regularly.   She has had difficulty obtaining Repatha.  Past Medical History:  Diagnosis Date   Anxiety    Arthritis    Colon polyps    Diverticulosis    Hx of diverticulitis of colon 08/2020   Hyperlipidemia    Hypertension    Liver cyst 08/26/2015   per ultrasound   Pneumonia    Past Surgical History:  Procedure Laterality Date   BUNIONECTOMY Right    COLONOSCOPY     HEMORROIDECTOMY     POLYPECTOMY     UPPER GASTROINTESTINAL ENDOSCOPY  10/11/2010   US ABDOMEN AND PELVIS  (ARMX HX)       Social History   Tobacco Use   Smoking status: Former    Current packs/day: 0.00    Average packs/day: 1 pack/day for 20.0 years (20.0 ttl pk-yrs)    Types: Cigarettes    Start date: 71    Quit date: 74    Years since quitting: 33.7   Smokeless tobacco: Never  Substance Use Topics   Alcohol use: Yes    Alcohol/week: 14.0 standard drinks of alcohol    Types: 14 Standard drinks or equivalent per week    Comment: 2 per day   Marital Status: Married   ROS  Review of Systems  Cardiovascular:  Negative for chest pain, dyspnea on exertion and leg swelling.   Objective  Blood pressure 117/71, pulse 69, resp. rate 16, height 5\' 4"  (1.626 m), weight 160 lb (72.6 kg), SpO2 97%. Body mass index is 27.46 kg/m.      02/05/2023   12:56 PM 02/04/2022    1:32 PM 02/04/2022    1:29 PM  Vitals with BMI  Height 5\' 4"   5\' 4"   Weight 160 lbs  163 lbs  BMI 27.45  27.97  Systolic 117 151 086  Diastolic 71 87 86  Pulse 69 62 63    Physical Exam Neck:     Vascular: No JVD.  Cardiovascular:     Rate and Rhythm: Normal rate and regular rhythm.     Pulses: Intact distal pulses.     Heart sounds: Normal heart sounds. No murmur heard.    No gallop.  Pulmonary:     Effort: Pulmonary effort is normal.     Breath sounds: Normal breath sounds.  Abdominal:     General: Bowel sounds are normal.     Palpations: Abdomen is soft.  Musculoskeletal:     Right lower leg: No edema.     Left lower leg: No edema.    Medications and allergies   Allergies  Allergen Reactions   Dexilant [Dexlansoprazole] Diarrhea    Medication list after today's encounter   Current Outpatient Medications:    acetaminophen (TYLENOL 8 HOUR ARTHRITIS PAIN) 650 MG CR tablet, PRN Oral, Disp: , Rfl:  ALPRAZolam (XANAX) 0.5 MG tablet, Take 0.5 mg by mouth as needed for anxiety., Disp: , Rfl:    ascorbic acid (VITAMIN C) 1000 MG tablet, Take 1,000 mg by mouth daily., Disp: , Rfl:    aspirin 81 MG tablet, Take 81 mg by mouth daily., Disp: , Rfl:    cholecalciferol (VITAMIN D3) 25 MCG (1000 UNIT) tablet, Take 1,000 Units by mouth daily., Disp: , Rfl:    Evolocumab (REPATHA SURECLICK) 140 MG/ML SOAJ, Inject 140 mg into the skin every 14 (fourteen) days., Disp: 6 mL, Rfl: 3   Magnesium 400 MG CAPS, take 400 mg daily for constipation, Disp: , Rfl:    olmesartan-hydrochlorothiazide (BENICAR HCT) 40-25 MG tablet, TAKE 1 TABLET BY MOUTH DAILY, Disp: 90 tablet, Rfl: 3  Laboratory examination:   Lab Results  Component Value Date   NA 141 12/03/2021   K 3.7 12/03/2021   CO2 26 08/29/2020   GLUCOSE 112 (H) 12/03/2021   BUN 18 12/03/2021   CREATININE 0.90 12/03/2021   CALCIUM 9.4 08/29/2020   GFRNONAA 54 (L) 08/29/2020       Latest  Ref Rng & Units 12/03/2021   10:34 AM 08/29/2020    4:58 AM  CMP  Glucose 70 - 99 mg/dL 161  096   BUN 8 - 23 mg/dL 18  19   Creatinine 0.45 - 1.00 mg/dL 4.09  8.11   Sodium 914 - 145 mmol/L 141  138   Potassium 3.5 - 5.1 mmol/L 3.7  3.5   Chloride 98 - 111 mmol/L 104  104   CO2 22 - 32 mmol/L  26   Calcium 8.9 - 10.3 mg/dL  9.4   Total Protein 6.5 - 8.1 g/dL  7.3   Total Bilirubin 0.3 - 1.2 mg/dL  0.9   Alkaline Phos 38 - 126 U/L  55   AST 15 - 41 U/L  17   ALT 0 - 44 U/L  20       Latest Ref Rng & Units 12/03/2021   10:34 AM 08/29/2020    4:58 AM  CBC  WBC 4.0 - 10.5 K/uL  8.9   Hemoglobin 12.0 - 15.0 g/dL 78.2  95.6   Hematocrit 36.0 - 46.0 % 42.0  39.2   Platelets 150 - 400 K/uL  236     External labs:   Labs 05/06/2022:   Serum glucose 126 mg, creatinine 0.8, BUN 13.  EGFR >70 mL, potassium 4.5.  Hb 13.8/HCT 41.2, platelets 240.  Total cholesterol 182, triglycerides 189, HDL 75, LDL 69.  TSH normal at 2.41.  1C 5.5%.  Labs 12/30/2017:  Total cholesterol 308, triglycerides 51, HDL 90, LDL 208.  Apolipoprotein B 157.  Radiology:    Cardiac Studies:   Coronary calcium score 02/05/2018 LM 0 LAD 340 LCx 176 RCA 654.  There is plaque in the proximal and acute marginal vessel. Total coronary calcium score 1170.  This places the patient between 90th and 100 percentile for female in this age range suggesting extensive atherosclerotic plaque and high likelihood of at least 1 significant coronary narrowing. Changes suggesting mild COPD and interstitial disease.  PCV MYOCARDIAL PERFUSION WO LEXISCAN 01/15/2022  Narrative Exercise nuclear stress test 01/15/22 Myocardial perfusion is normal. Low risk study. Overall LV systolic function is normal without regional wall motion abnormalities. Stress LV EF: 70%. Normal ECG stress. The patient exercised for 6 minutes and 19 seconds of a Modified Bruce protocol, achieving approximately 7.52 METs and 88% MPHR. The heart rate  response was normal. The blood pressure response was physiologic. Compared to 02/23/2018, no change.  PCV ECHOCARDIOGRAM COMPLETE 01/24/2022  Narrative Echocardiogram 01/24/2022: Normal LV systolic function with visual EF 55-60%. Left ventricle cavity is normal in size. Normal left ventricular wall thickness. Normal global wall motion. Doppler evidence of grade I (impaired) diastolic dysfunction, normal LAP. Calculated EF 58%. Structurally normal tricuspid valve with trace regurgitation. No evidence of pulmonary hypertension. No prior available for comparison.    EKG:   EKG 12/20/2021: Normal sinus rhythm at rate of 72 bpm, normal axis.  Nonspecific T abnormality, cannot exclude high lateral ischemia.  Compared to 01/22/2018, T wave abnormality is new.  Assessment     ICD-10-CM   1. Elevated coronary artery calcium score 02/05/2018: Total coronary calcium score 1170.  90th and 100 percentile  MESA database.  R93.1 Evolocumab (REPATHA SURECLICK) 140 MG/ML SOAJ    DISCONTINUED: REPATHA PUSHTRONEX SYSTEM 420 MG/3.5ML SOCT    2. Primary hypertension  I10 EKG 12-Lead    3. Pure hypercholesterolemia  E78.00 Evolocumab (REPATHA SURECLICK) 140 MG/ML SOAJ    DISCONTINUED: REPATHA PUSHTRONEX SYSTEM 420 MG/3.5ML SOCT    4. Statin myopathy  G72.0    T46.6X5A      Orders Placed This Encounter  Procedures   EKG 12-Lead   Meds ordered this encounter  Medications   DISCONTD: REPATHA PUSHTRONEX SYSTEM 420 MG/3.5ML SOCT    Sig: Inject 1 Syringe into the skin every 30 (thirty) days.    Dispense:  10.5 mL    Refill:  3   Evolocumab (REPATHA SURECLICK) 140 MG/ML SOAJ    Sig: Inject 140 mg into the skin every 14 (fourteen) days.    Dispense:  6 mL    Refill:  3   Medications Discontinued During This Encounter  Medication Reason   REPATHA PUSHTRONEX SYSTEM 420 MG/3.5ML SOCT Reorder   REPATHA PUSHTRONEX SYSTEM 420 MG/3.5ML SOCT National Drug Shortage      Recommendations:   Madison [name] is a  72 y.o.  Caucasian female with hypertension, hyperlipidemia, probably heterozygous familial hyperlipidemia having markedly elevated lipids for several years and has been on and off statins due to severe myalgias, myopathy with elevated CK enzymes.  Due to coronary calcium score in the 90th to 100 percentile for age, she is presently on Repatha which she is tolerating.  1. Elevated coronary artery calcium score 02/05/2018: Total coronary calcium score 1170.  90th and 100 percentile  MESA database. Patient has been having difficulty in obtaining Repatha Pushtronex once a month dose, will try at [facility] pharmacy and if not she is willing to switch over 240 mg subcu q. 2 weeks dose.  Her lipids reviewed, LDL is at goal.  - REPATHA PUSHTRONEX SYSTEM 420 MG/3.5ML SOCT; Inject 1 Syringe into the skin every 30 (thirty) days.  Dispense: 10.5 mL; Refill: 3  2. Primary hypertension Blood pressure is very well-controlled with olmesartan HCT, labs reviewed, renal function is normal, continue the same.  No change in the EKG. - EKG 12-Lead  3. Pure hypercholesterolemia As dictated above, patient is tolerating Repatha with LDL being at goal. - REPATHA PUSHTRONEX SYSTEM 420 MG/3.5ML SOCT; Inject 1 Syringe into the skin every 30 (thirty) days.  Dispense: 10.5 mL; Refill: 3  4. Statin myopathy She has had statin myopathy with elevated CK enzymes on statins, hence not on statin therapy although she has significant coronary calcification and coronary artery disease without angina pectoris.  I will see her back on a as  needed basis.  She has had a negative nuclear stress test just a year ago and also was able to exercise for >7 METS without any chest pain or dyspnea and presently she remains asymptomatic.    Yates Decamp, MD, [facility] 02/05/2023, 2:35 PM Office: [phone]

## 2023-02-10 ENCOUNTER — Other Ambulatory Visit (HOSPITAL_COMMUNITY): Payer: Self-pay

## 2023-03-17 ENCOUNTER — Other Ambulatory Visit: Payer: Self-pay

## 2023-03-18 ENCOUNTER — Other Ambulatory Visit (HOSPITAL_COMMUNITY): Payer: Self-pay

## 2023-03-18 DIAGNOSIS — H35373 Puckering of macula, bilateral: Secondary | ICD-10-CM | POA: Diagnosis not present

## 2023-03-18 DIAGNOSIS — H5213 Myopia, bilateral: Secondary | ICD-10-CM | POA: Diagnosis not present

## 2023-04-16 DIAGNOSIS — Z23 Encounter for immunization: Secondary | ICD-10-CM | POA: Diagnosis not present

## 2023-04-21 ENCOUNTER — Other Ambulatory Visit (HOSPITAL_COMMUNITY): Payer: Self-pay

## 2023-04-21 ENCOUNTER — Other Ambulatory Visit: Payer: Self-pay

## 2023-04-30 DIAGNOSIS — I10 Essential (primary) hypertension: Secondary | ICD-10-CM | POA: Diagnosis not present

## 2023-04-30 DIAGNOSIS — J069 Acute upper respiratory infection, unspecified: Secondary | ICD-10-CM | POA: Diagnosis not present

## 2023-04-30 DIAGNOSIS — R051 Acute cough: Secondary | ICD-10-CM | POA: Diagnosis not present

## 2023-05-15 DIAGNOSIS — I10 Essential (primary) hypertension: Secondary | ICD-10-CM | POA: Diagnosis not present

## 2023-05-15 DIAGNOSIS — E559 Vitamin D deficiency, unspecified: Secondary | ICD-10-CM | POA: Diagnosis not present

## 2023-05-15 DIAGNOSIS — E785 Hyperlipidemia, unspecified: Secondary | ICD-10-CM | POA: Diagnosis not present

## 2023-05-30 ENCOUNTER — Other Ambulatory Visit (HOSPITAL_COMMUNITY): Payer: Self-pay

## 2023-06-03 DIAGNOSIS — K219 Gastro-esophageal reflux disease without esophagitis: Secondary | ICD-10-CM | POA: Diagnosis not present

## 2023-06-03 DIAGNOSIS — I10 Essential (primary) hypertension: Secondary | ICD-10-CM | POA: Diagnosis not present

## 2023-06-03 DIAGNOSIS — Z1339 Encounter for screening examination for other mental health and behavioral disorders: Secondary | ICD-10-CM | POA: Diagnosis not present

## 2023-06-03 DIAGNOSIS — G720 Drug-induced myopathy: Secondary | ICD-10-CM | POA: Diagnosis not present

## 2023-06-03 DIAGNOSIS — I2584 Coronary atherosclerosis due to calcified coronary lesion: Secondary | ICD-10-CM | POA: Diagnosis not present

## 2023-06-03 DIAGNOSIS — M79674 Pain in right toe(s): Secondary | ICD-10-CM | POA: Diagnosis not present

## 2023-06-03 DIAGNOSIS — Z1331 Encounter for screening for depression: Secondary | ICD-10-CM | POA: Diagnosis not present

## 2023-06-03 DIAGNOSIS — Z0000 Encounter for general adult medical examination without abnormal findings: Secondary | ICD-10-CM | POA: Diagnosis not present

## 2023-06-03 DIAGNOSIS — M21619 Bunion of unspecified foot: Secondary | ICD-10-CM | POA: Diagnosis not present

## 2023-06-03 DIAGNOSIS — E785 Hyperlipidemia, unspecified: Secondary | ICD-10-CM | POA: Diagnosis not present

## 2023-06-03 DIAGNOSIS — F418 Other specified anxiety disorders: Secondary | ICD-10-CM | POA: Diagnosis not present

## 2023-06-03 DIAGNOSIS — M205X2 Other deformities of toe(s) (acquired), left foot: Secondary | ICD-10-CM | POA: Diagnosis not present

## 2023-06-04 ENCOUNTER — Other Ambulatory Visit: Payer: Self-pay | Admitting: Internal Medicine

## 2023-06-04 DIAGNOSIS — Z1231 Encounter for screening mammogram for malignant neoplasm of breast: Secondary | ICD-10-CM

## 2023-07-22 ENCOUNTER — Other Ambulatory Visit (HOSPITAL_COMMUNITY): Payer: Self-pay

## 2023-07-28 ENCOUNTER — Ambulatory Visit
Admission: RE | Admit: 2023-07-28 | Discharge: 2023-07-28 | Disposition: A | Discharge status: Home / Self Care | Payer: Medicare Other | Source: Ambulatory Visit | Attending: Internal Medicine | Admitting: Internal Medicine

## 2023-07-28 DIAGNOSIS — Z1231 Encounter for screening mammogram for malignant neoplasm of breast: Secondary | ICD-10-CM | POA: Diagnosis not present

## 2023-11-11 ENCOUNTER — Other Ambulatory Visit (HOSPITAL_COMMUNITY): Payer: Self-pay

## 2023-11-11 DIAGNOSIS — K5790 Diverticulosis of intestine, part unspecified, without perforation or abscess without bleeding: Secondary | ICD-10-CM | POA: Diagnosis not present

## 2023-11-11 DIAGNOSIS — R109 Unspecified abdominal pain: Secondary | ICD-10-CM | POA: Diagnosis not present

## 2023-11-11 DIAGNOSIS — K5909 Other constipation: Secondary | ICD-10-CM | POA: Diagnosis not present

## 2023-11-11 DIAGNOSIS — R197 Diarrhea, unspecified: Secondary | ICD-10-CM | POA: Diagnosis not present

## 2023-11-11 MED ORDER — CIPROFLOXACIN HCL 500 MG PO TABS
500.0000 mg | ORAL_TABLET | Freq: Two times a day (BID) | ORAL | 0 refills | Status: DC
  Filled 2023-11-11 (×2): qty 10, 5d supply, fill #0

## 2023-11-14 DIAGNOSIS — H35372 Puckering of macula, left eye: Secondary | ICD-10-CM | POA: Diagnosis not present

## 2023-11-14 DIAGNOSIS — H26492 Other secondary cataract, left eye: Secondary | ICD-10-CM | POA: Diagnosis not present

## 2023-11-18 ENCOUNTER — Other Ambulatory Visit (HOSPITAL_COMMUNITY): Payer: Self-pay

## 2023-11-19 ENCOUNTER — Encounter: Payer: Self-pay | Admitting: Family Medicine

## 2023-11-19 ENCOUNTER — Other Ambulatory Visit: Payer: Self-pay | Admitting: Internal Medicine

## 2023-11-19 DIAGNOSIS — K529 Noninfective gastroenteritis and colitis, unspecified: Secondary | ICD-10-CM

## 2023-11-19 DIAGNOSIS — R109 Unspecified abdominal pain: Secondary | ICD-10-CM | POA: Diagnosis not present

## 2023-11-19 DIAGNOSIS — R197 Diarrhea, unspecified: Secondary | ICD-10-CM | POA: Diagnosis not present

## 2023-11-19 DIAGNOSIS — R140 Abdominal distension (gaseous): Secondary | ICD-10-CM

## 2023-11-19 DIAGNOSIS — Z8719 Personal history of other diseases of the digestive system: Secondary | ICD-10-CM

## 2023-11-20 ENCOUNTER — Other Ambulatory Visit: Payer: Self-pay | Admitting: Internal Medicine

## 2023-11-20 DIAGNOSIS — K529 Noninfective gastroenteritis and colitis, unspecified: Secondary | ICD-10-CM

## 2023-11-20 DIAGNOSIS — K5790 Diverticulosis of intestine, part unspecified, without perforation or abscess without bleeding: Secondary | ICD-10-CM

## 2023-11-20 DIAGNOSIS — Z8719 Personal history of other diseases of the digestive system: Secondary | ICD-10-CM

## 2023-11-20 DIAGNOSIS — R140 Abdominal distension (gaseous): Secondary | ICD-10-CM

## 2023-11-21 ENCOUNTER — Ambulatory Visit
Admission: RE | Admit: 2023-11-21 | Discharge: 2023-11-21 | Disposition: A | Discharge status: Home / Self Care | Source: Ambulatory Visit | Attending: Internal Medicine | Admitting: Internal Medicine

## 2023-11-21 DIAGNOSIS — R140 Abdominal distension (gaseous): Secondary | ICD-10-CM

## 2023-11-21 DIAGNOSIS — K5790 Diverticulosis of intestine, part unspecified, without perforation or abscess without bleeding: Secondary | ICD-10-CM

## 2023-11-21 DIAGNOSIS — N200 Calculus of kidney: Secondary | ICD-10-CM | POA: Diagnosis not present

## 2023-11-21 DIAGNOSIS — K5730 Diverticulosis of large intestine without perforation or abscess without bleeding: Secondary | ICD-10-CM | POA: Diagnosis not present

## 2023-11-21 DIAGNOSIS — Z8719 Personal history of other diseases of the digestive system: Secondary | ICD-10-CM

## 2023-11-21 DIAGNOSIS — K529 Noninfective gastroenteritis and colitis, unspecified: Secondary | ICD-10-CM

## 2023-11-22 ENCOUNTER — Encounter (HOSPITAL_BASED_OUTPATIENT_CLINIC_OR_DEPARTMENT_OTHER): Payer: Self-pay

## 2023-11-22 ENCOUNTER — Inpatient Hospital Stay (HOSPITAL_BASED_OUTPATIENT_CLINIC_OR_DEPARTMENT_OTHER)
Admission: EM | Admit: 2023-11-22 | Discharge: 2023-11-24 | DRG: 684 | Disposition: A | Discharge status: Home / Self Care | Attending: Internal Medicine | Admitting: Internal Medicine

## 2023-11-22 ENCOUNTER — Other Ambulatory Visit: Payer: Self-pay

## 2023-11-22 DIAGNOSIS — I959 Hypotension, unspecified: Secondary | ICD-10-CM | POA: Diagnosis present

## 2023-11-22 DIAGNOSIS — K5730 Diverticulosis of large intestine without perforation or abscess without bleeding: Secondary | ICD-10-CM | POA: Diagnosis present

## 2023-11-22 DIAGNOSIS — Z87891 Personal history of nicotine dependence: Secondary | ICD-10-CM

## 2023-11-22 DIAGNOSIS — R197 Diarrhea, unspecified: Secondary | ICD-10-CM | POA: Diagnosis not present

## 2023-11-22 DIAGNOSIS — E785 Hyperlipidemia, unspecified: Secondary | ICD-10-CM | POA: Diagnosis present

## 2023-11-22 DIAGNOSIS — Z888 Allergy status to other drugs, medicaments and biological substances status: Secondary | ICD-10-CM | POA: Diagnosis not present

## 2023-11-22 DIAGNOSIS — R109 Unspecified abdominal pain: Secondary | ICD-10-CM | POA: Diagnosis present

## 2023-11-22 DIAGNOSIS — K529 Noninfective gastroenteritis and colitis, unspecified: Secondary | ICD-10-CM | POA: Diagnosis not present

## 2023-11-22 DIAGNOSIS — Z7982 Long term (current) use of aspirin: Secondary | ICD-10-CM | POA: Diagnosis not present

## 2023-11-22 DIAGNOSIS — Z820 Family history of epilepsy and other diseases of the nervous system: Secondary | ICD-10-CM

## 2023-11-22 DIAGNOSIS — Z801 Family history of malignant neoplasm of trachea, bronchus and lung: Secondary | ICD-10-CM | POA: Diagnosis not present

## 2023-11-22 DIAGNOSIS — Z8241 Family history of sudden cardiac death: Secondary | ICD-10-CM | POA: Diagnosis not present

## 2023-11-22 DIAGNOSIS — Z8249 Family history of ischemic heart disease and other diseases of the circulatory system: Secondary | ICD-10-CM | POA: Diagnosis not present

## 2023-11-22 DIAGNOSIS — N179 Acute kidney failure, unspecified: Secondary | ICD-10-CM | POA: Diagnosis not present

## 2023-11-22 DIAGNOSIS — E860 Dehydration: Secondary | ICD-10-CM | POA: Diagnosis not present

## 2023-11-22 DIAGNOSIS — I10 Essential (primary) hypertension: Secondary | ICD-10-CM | POA: Diagnosis not present

## 2023-11-22 DIAGNOSIS — M1990 Unspecified osteoarthritis, unspecified site: Secondary | ICD-10-CM | POA: Diagnosis present

## 2023-11-22 DIAGNOSIS — F411 Generalized anxiety disorder: Secondary | ICD-10-CM | POA: Diagnosis present

## 2023-11-22 DIAGNOSIS — Z860100 Personal history of colon polyps, unspecified: Secondary | ICD-10-CM

## 2023-11-22 DIAGNOSIS — E7849 Other hyperlipidemia: Secondary | ICD-10-CM | POA: Diagnosis not present

## 2023-11-22 LAB — COMPREHENSIVE METABOLIC PANEL WITH GFR
ALT: 12 U/L (ref 0–44)
AST: 17 U/L (ref 15–41)
Albumin: 4.1 g/dL (ref 3.5–5.0)
Alkaline Phosphatase: 52 U/L (ref 38–126)
Anion gap: 11 (ref 5–15)
BUN: 26 mg/dL — ABNORMAL HIGH (ref 8–23)
CO2: 25 mmol/L (ref 22–32)
Calcium: 9.7 mg/dL (ref 8.9–10.3)
Chloride: 102 mmol/L (ref 98–111)
Creatinine, Ser: 1.58 mg/dL — ABNORMAL HIGH (ref 0.44–1.00)
GFR, Estimated: 34 mL/min — ABNORMAL LOW (ref >60)
Glucose, Bld: 111 mg/dL — ABNORMAL HIGH (ref 70–99)
Potassium: 4.1 mmol/L (ref 3.5–5.1)
Sodium: 138 mmol/L (ref 135–145)
Total Bilirubin: 0.6 mg/dL (ref 0.0–1.2)
Total Protein: 7 g/dL (ref 6.5–8.1)

## 2023-11-22 LAB — CBC
HCT: 39.9 % (ref 36.0–46.0)
Hemoglobin: 13.7 g/dL (ref 12.0–15.0)
MCH: 31.3 pg (ref 26.0–34.0)
MCHC: 34.3 g/dL (ref 30.0–36.0)
MCV: 91.1 fL (ref 80.0–100.0)
Platelets: 260 10*3/uL (ref 150–400)
RBC: 4.38 MIL/uL (ref 3.87–5.11)
RDW: 12.9 % (ref 11.5–15.5)
WBC: 11.4 10*3/uL — ABNORMAL HIGH (ref 4.0–10.5)
nRBC: 0 % (ref 0.0–0.2)

## 2023-11-22 LAB — URINALYSIS, ROUTINE W REFLEX MICROSCOPIC
Bilirubin Urine: NEGATIVE
Glucose, UA: NEGATIVE mg/dL
Hgb urine dipstick: NEGATIVE
Ketones, ur: NEGATIVE mg/dL
Leukocytes,Ua: NEGATIVE
Nitrite: NEGATIVE
Protein, ur: NEGATIVE mg/dL
Specific Gravity, Urine: 1.01 (ref 1.005–1.030)
pH: 5 (ref 5.0–8.0)

## 2023-11-22 LAB — LIPASE, BLOOD: Lipase: 84 U/L — ABNORMAL HIGH (ref 11–51)

## 2023-11-22 MED ORDER — LACTATED RINGERS IV BOLUS
2000.0000 mL | Freq: Once | INTRAVENOUS | Status: AC
Start: 2023-11-22 — End: 2023-11-23
  Administered 2023-11-22: 2000 mL via INTRAVENOUS

## 2023-11-22 NOTE — ED Provider Notes (Signed)
 [facility] EMERGENCY DEPARTMENT AT [facility] Provider Note   CSN: [id number] Arrival date & time: 11/22/23  1555     Patient presents with: Abdominal Pain   Madison [name] is a 73 y.o. female.   73 year old female presents with her husband for concern of ongoing diarrhea which has now been going on for 6 weeks.  She also states she is becoming fatigued slightly lightheaded.  Has minimal p.o. intake.  Endorses nausea but denies any vomiting.  States she is having up to 6 episodes of diarrhea overnight.  Denies any blood in her diarrhea.  States this is mostly watery.  Had recent stool studies done at her PCP office but states she called before she came in today to check on the results but these had not resulted.  Unsure if she was checked for C. difficile.  Did have recent course of antibiotics a couple weeks ago where she took clindamycin.  She states she initially got better but then worse again.  She has also been working out in the heat in her garden, and also drinks alcohol .  Endorses abdominal cramping when she has the sensation to go to the bathroom otherwise denies abdominal pain.  The history is provided by the patient. No language interpreter was used.       Prior to Admission medications   Medication Sig Start Date End Date Taking? Authorizing Provider  acetaminophen  (TYLENOL  8 HOUR ARTHRITIS PAIN) 650 MG CR tablet PRN Oral 04/05/19   [provider]  ALPRAZolam (XANAX) 0.5 MG tablet Take 0.5 mg by mouth as needed for anxiety.    [provider]  ascorbic acid (VITAMIN C) 1000 MG tablet Take 1,000 mg by mouth daily.    [provider]  aspirin 81 MG tablet Take 81 mg by mouth daily.    [provider]  cholecalciferol (VITAMIN D3) 25 MCG (1000 UNIT) tablet Take 1,000 Units by mouth daily.    [provider]  ciprofloxacin  (CIPRO ) 500 MG tablet Take 1 tablet (500 mg total) by mouth 2 (two) times daily for 5 days 11/11/23      Evolocumab  (REPATHA  SURECLICK) 140 MG/ML SOAJ Inject 140 mg into the skin every 14 (fourteen) days. 02/05/23   Ladona Heinz, MD  Magnesium 400 MG CAPS take 400 mg daily for constipation    [provider]  olmesartan -hydrochlorothiazide (BENICAR  HCT) 40-25 MG tablet TAKE 1 TABLET BY MOUTH DAILY 02/04/23   Ladona Heinz, MD    Allergies: Dexilant [dexlansoprazole]    Review of Systems  Constitutional:  Negative for chills and fever.  Respiratory:  Negative for shortness of breath.   Cardiovascular:  Negative for chest pain.  Gastrointestinal:  Positive for diarrhea and nausea. Negative for abdominal pain and vomiting.  Neurological:  Positive for light-headedness.  All other systems reviewed and are negative.   Updated Vital Signs BP 103/61 (BP Location: Right Arm)   Pulse 70   Temp 98.1 F (36.7 C) (Oral)   Resp 18   Ht 5' 4 (1.626 m)   Wt 72.6 kg   SpO2 98%   BMI 27.46 kg/m   Physical Exam Vitals and nursing note reviewed.  Constitutional:      General: She is not in acute distress.    Appearance: Normal appearance. She is not ill-appearing.  HENT:     Head: Normocephalic and atraumatic.     Nose: Nose normal.   Eyes:     Conjunctiva/sclera: Conjunctivae normal.    Cardiovascular:  Rate and Rhythm: Normal rate and regular rhythm.  Pulmonary:     Effort: Pulmonary effort is normal. No respiratory distress.  Abdominal:     General: There is no distension.     Palpations: Abdomen is soft.     Tenderness: There is no abdominal tenderness. There is no guarding.   Musculoskeletal:        General: No deformity. Normal range of motion.     Cervical back: Normal range of motion.   Skin:    Findings: No rash.   Neurological:     Mental Status: She is alert.     (all labs ordered are listed, but only abnormal results are displayed) Labs Reviewed  LIPASE, BLOOD - Abnormal; Notable for the following components:      Result Value   Lipase 84 (*)    All  other components within normal limits  COMPREHENSIVE METABOLIC PANEL WITH GFR - Abnormal; Notable for the following components:   Glucose, Bld 111 (*)    BUN 26 (*)    Creatinine, Ser 1.58 (*)    GFR, Estimated 34 (*)    All other components within normal limits  CBC - Abnormal; Notable for the following components:   WBC 11.4 (*)    All other components within normal limits  C DIFFICILE QUICK SCREEN W PCR REFLEX    GASTROINTESTINAL PANEL BY PCR, STOOL (REPLACES STOOL CULTURE)  URINALYSIS, ROUTINE W REFLEX MICROSCOPIC    EKG: None  Radiology: CT ABDOMEN PELVIS WO CONTRAST Result Date: 11/21/2023 CLINICAL DATA:  Abdominal cramping and bloating. Diarrhea for 6 weeks. Diverticulosis. EXAM: CT ABDOMEN AND PELVIS WITHOUT CONTRAST TECHNIQUE: Multidetector CT imaging of the abdomen and pelvis was performed following the standard protocol without IV contrast. RADIATION DOSE REDUCTION: This exam was performed according to the departmental dose-optimization program which includes automated exposure control, adjustment of the mA and/or kV according to patient size and/or use of iterative reconstruction technique. COMPARISON:  08/29/2020 FINDINGS: Lower chest: No acute findings. Hepatobiliary: No mass visualized on this unenhanced exam. Gallbladder is unremarkable. No evidence of biliary ductal dilatation. Pancreas: No mass or inflammatory process visualized on this unenhanced exam. Spleen:  Within normal limits in size. Adrenals/Urinary tract: A few punctate right renal calculi are again seen. No evidence of ureteral calculi or hydronephrosis. Unremarkable unopacified urinary bladder. Stomach/Bowel: No evidence of obstruction, inflammatory process, or abnormal fluid collections. Normal appendix visualized. Extensive colonic diverticulosis is seen, without signs of diverticulitis. Vascular/Lymphatic: No pathologically enlarged lymph nodes identified. No evidence of abdominal aortic aneurysm. Reproductive:   No mass or other significant abnormality. Other:  None. Musculoskeletal: No suspicious bone lesions identified. Advanced lumbar spine degenerative changes again seen. Bilateral L5 pars defects again seen, without associated spondylolisthesis. IMPRESSION: No acute findings. Colonic diverticulosis, without radiographic evidence of diverticulitis. Tiny right renal calculi. No evidence of ureteral calculi or hydronephrosis. Electronically Signed   By: Norleen DELENA Kil M.D.   On: 11/21/2023 20:37     Procedures   Medications Ordered in the ED  lactated ringers  bolus 2,000 mL (has no administration in time range)    Clinical Course as of 11/22/23 2304  Sat Nov 22, 2023  2219 Creatinine(!): 1.58 [AA]    Clinical Course User Index [AA] Hildegard Loge, PA-C                                 Medical Decision Making Amount and/or Complexity of Data  Reviewed Labs: ordered. Decision-making details documented in ED Course.  Risk Decision regarding hospitalization.   Medical Decision Making / ED Course   This patient presents to the ED for concern of diarrhea, lightheadedness, this involves an extensive number of treatment options, and is a complaint that carries with it a high risk of complications and morbidity.  The differential diagnosis includes dehydration, C. difficile  MDM: 73 year old female presents today for concern of ongoing diarrhea. Endorses some lightheadedness and fatigue. No blood in the diarrhea.  CBC with mild leukocytosis.  No anemia.  UA without evidence of UTI.  CMP with creatinine of 1.58 baseline of around 1.0.  Lipase of 84.  Stool studies ordered. Will provide IV fluids.  Likely multifactorial AKI due to poor p.o. intake, diarrhea, and working in the heat.  Patient would prefer admission. Discussed with hospitalist who will accept patient for admission.   Additional history obtained: -Additional history obtained from husband at bedside -External records from outside  source obtained and reviewed including: Chart review including previous notes, labs, imaging, consultation notes   Lab Tests: -I ordered, reviewed, and interpreted labs.   The pertinent results include:   Labs Reviewed  LIPASE, BLOOD - Abnormal; Notable for the following components:      Result Value   Lipase 84 (*)    All other components within normal limits  COMPREHENSIVE METABOLIC PANEL WITH GFR - Abnormal; Notable for the following components:   Glucose, Bld 111 (*)    BUN 26 (*)    Creatinine, Ser 1.58 (*)    GFR, Estimated 34 (*)    All other components within normal limits  CBC - Abnormal; Notable for the following components:   WBC 11.4 (*)    All other components within normal limits  C DIFFICILE QUICK SCREEN W PCR REFLEX    GASTROINTESTINAL PANEL BY PCR, STOOL (REPLACES STOOL CULTURE)  URINALYSIS, ROUTINE W REFLEX MICROSCOPIC      EKG  EKG Interpretation Date/Time:    Ventricular Rate:    PR Interval:    QRS Duration:    QT Interval:    QTC Calculation:   R Axis:      Text Interpretation:          Medicines ordered and prescription drug management: Meds ordered this encounter  Medications   lactated ringers  bolus 2,000 mL    -I have reviewed the patients home medicines and have made adjustments as needed     Reevaluation: After the interventions noted above, I reevaluated the patient and found that they have :stayed the same  Co morbidities that complicate the patient evaluation  Past Medical History:  Diagnosis Date   Anxiety    Arthritis    Colon polyps    Diverticulosis    Hx of diverticulitis of colon 08/2020   Hyperlipidemia    Hypertension    Liver cyst 08/26/2015   per ultrasound   Pneumonia       Dispostion: Discussed with hospitalist who will accept patient for admission.  Final diagnoses:  AKI (acute kidney injury) (HCC)  Diarrhea, unspecified type    ED Discharge Orders     None          Hildegard Loge,  PA-C 11/22/23 2336    Tegeler, Lonni PARAS, MD 11/23/23 [phone]

## 2023-11-22 NOTE — ED Triage Notes (Signed)
 Complaining of diarrhea tor the last 6 weeks. Went to PCP for it and was tested 2 weels ago. Is feeling weak with no energy.

## 2023-11-23 ENCOUNTER — Encounter (HOSPITAL_COMMUNITY): Payer: Self-pay | Admitting: Internal Medicine

## 2023-11-23 DIAGNOSIS — F411 Generalized anxiety disorder: Secondary | ICD-10-CM | POA: Insufficient documentation

## 2023-11-23 DIAGNOSIS — Z8249 Family history of ischemic heart disease and other diseases of the circulatory system: Secondary | ICD-10-CM | POA: Diagnosis not present

## 2023-11-23 DIAGNOSIS — Z860100 Personal history of colon polyps, unspecified: Secondary | ICD-10-CM | POA: Diagnosis not present

## 2023-11-23 DIAGNOSIS — Z801 Family history of malignant neoplasm of trachea, bronchus and lung: Secondary | ICD-10-CM | POA: Diagnosis not present

## 2023-11-23 DIAGNOSIS — E785 Hyperlipidemia, unspecified: Secondary | ICD-10-CM | POA: Insufficient documentation

## 2023-11-23 DIAGNOSIS — K529 Noninfective gastroenteritis and colitis, unspecified: Secondary | ICD-10-CM

## 2023-11-23 DIAGNOSIS — R109 Unspecified abdominal pain: Secondary | ICD-10-CM | POA: Diagnosis present

## 2023-11-23 DIAGNOSIS — I10 Essential (primary) hypertension: Secondary | ICD-10-CM | POA: Insufficient documentation

## 2023-11-23 DIAGNOSIS — E7849 Other hyperlipidemia: Secondary | ICD-10-CM

## 2023-11-23 DIAGNOSIS — Z7982 Long term (current) use of aspirin: Secondary | ICD-10-CM | POA: Diagnosis not present

## 2023-11-23 DIAGNOSIS — Z87891 Personal history of nicotine dependence: Secondary | ICD-10-CM | POA: Diagnosis not present

## 2023-11-23 DIAGNOSIS — N179 Acute kidney failure, unspecified: Secondary | ICD-10-CM | POA: Diagnosis present

## 2023-11-23 DIAGNOSIS — Z888 Allergy status to other drugs, medicaments and biological substances status: Secondary | ICD-10-CM | POA: Diagnosis not present

## 2023-11-23 DIAGNOSIS — Z8241 Family history of sudden cardiac death: Secondary | ICD-10-CM | POA: Diagnosis not present

## 2023-11-23 DIAGNOSIS — Z820 Family history of epilepsy and other diseases of the nervous system: Secondary | ICD-10-CM | POA: Diagnosis not present

## 2023-11-23 DIAGNOSIS — I959 Hypotension, unspecified: Secondary | ICD-10-CM | POA: Diagnosis present

## 2023-11-23 DIAGNOSIS — M1990 Unspecified osteoarthritis, unspecified site: Secondary | ICD-10-CM | POA: Diagnosis present

## 2023-11-23 DIAGNOSIS — K5730 Diverticulosis of large intestine without perforation or abscess without bleeding: Secondary | ICD-10-CM | POA: Diagnosis present

## 2023-11-23 DIAGNOSIS — E860 Dehydration: Secondary | ICD-10-CM | POA: Diagnosis present

## 2023-11-23 LAB — C DIFFICILE QUICK SCREEN W PCR REFLEX
C Diff antigen: NEGATIVE
C Diff interpretation: NOT DETECTED
C Diff toxin: NEGATIVE

## 2023-11-23 LAB — GASTROINTESTINAL PANEL BY PCR, STOOL (REPLACES STOOL CULTURE)

## 2023-11-23 MED ORDER — RISAQUAD PO CAPS
1.0000 | ORAL_CAPSULE | Freq: Every day | ORAL | Status: DC
  Administered 2023-11-23: 1 via ORAL
  Filled 2023-11-23: qty 1

## 2023-11-23 MED ORDER — SODIUM CHLORIDE 0.9% FLUSH
3.0000 mL | Freq: Two times a day (BID) | INTRAVENOUS | Status: DC
  Administered 2023-11-23 (×2): 3 mL via INTRAVENOUS

## 2023-11-23 MED ORDER — PNEUMOCOCCAL 20-VAL CONJ VACC 0.5 ML IM SUSY
0.5000 mL | PREFILLED_SYRINGE | INTRAMUSCULAR | Status: DC
  Filled 2023-11-23: qty 0.5

## 2023-11-23 MED ORDER — DIPHENOXYLATE-ATROPINE 2.5-0.025 MG PO TABS
1.0000 | ORAL_TABLET | Freq: Two times a day (BID) | ORAL | Status: DC
  Administered 2023-11-23 (×2): 1 via ORAL
  Filled 2023-11-23 (×2): qty 1

## 2023-11-23 MED ORDER — SODIUM CHLORIDE 0.9 % IV SOLN: 250.0000 mL | INTRAVENOUS | Status: AC | PRN

## 2023-11-23 MED ORDER — SODIUM CHLORIDE 0.9 % IV SOLN: INTRAVENOUS | Status: AC

## 2023-11-23 MED ORDER — ENOXAPARIN SODIUM 30 MG/0.3ML IJ SOSY
30.0000 mg | PREFILLED_SYRINGE | INTRAMUSCULAR | Status: DC
  Administered 2023-11-23: 30 mg via SUBCUTANEOUS
  Filled 2023-11-23: qty 0.3

## 2023-11-23 MED ORDER — ACETAMINOPHEN 650 MG RE SUPP: 650.0000 mg | Freq: Four times a day (QID) | RECTAL | Status: DC | PRN

## 2023-11-23 MED ORDER — ALPRAZOLAM 0.5 MG PO TABS: 0.5000 mg | ORAL_TABLET | Freq: Every day | ORAL | Status: DC | PRN

## 2023-11-23 MED ORDER — SODIUM CHLORIDE 0.9% FLUSH: 3.0000 mL | INTRAVENOUS | Status: DC | PRN

## 2023-11-23 MED ORDER — ONDANSETRON HCL 4 MG PO TABS: 4.0000 mg | ORAL_TABLET | Freq: Four times a day (QID) | ORAL | Status: DC | PRN

## 2023-11-23 MED ORDER — BISMUTH SUBSALICYLATE 262 MG/15ML PO SUSP: 30.0000 mL | ORAL | Status: DC | PRN

## 2023-11-23 MED ORDER — ENSURE PLUS HIGH PROTEIN PO LIQD
237.0000 mL | Freq: Two times a day (BID) | ORAL | Status: DC
  Administered 2023-11-23: 237 mL via ORAL

## 2023-11-23 MED ORDER — ACETAMINOPHEN 325 MG PO TABS
650.0000 mg | ORAL_TABLET | Freq: Four times a day (QID) | ORAL | Status: DC | PRN
  Administered 2023-11-23: 650 mg via ORAL
  Filled 2023-11-23: qty 2

## 2023-11-23 MED ORDER — ONDANSETRON HCL 4 MG/2ML IJ SOLN
4.0000 mg | Freq: Four times a day (QID) | INTRAMUSCULAR | Status: DC | PRN
Start: 2023-11-23 — End: 2023-11-24

## 2023-11-23 MED ORDER — ASPIRIN 81 MG PO TBEC
81.0000 mg | DELAYED_RELEASE_TABLET | Freq: Every day | ORAL | Status: DC
  Administered 2023-11-23: 81 mg via ORAL
  Filled 2023-11-23: qty 1

## 2023-11-23 MED ORDER — LOPERAMIDE HCL 2 MG PO CAPS
2.0000 mg | ORAL_CAPSULE | ORAL | Status: DC | PRN
  Administered 2023-11-23: 2 mg via ORAL
  Filled 2023-11-23: qty 1

## 2023-11-23 NOTE — H&P (Addendum)
 History and Physical    Madison [name] FMW:[id number] DOB: 1950-06-09 DOA: 11/22/2023  PCP: Larnell Hamilton, MD   Patient coming from: Home   Chief Complaint:  Chief Complaint  Patient presents with   Abdominal Pain   ED TRIAGE note:Complaining of diarrhea tor the last 6 weeks. Went to PCP for it and was tested 2 weels ago. Is feeling weak with no energy.   HPI:  Madison [name] is a 73 y.o. female with medical history significant of essential hypertension, hyperlipidemia, arthritis, and generalized anxiety disorder presented to emergency department with complaining of ongoing diarrhea for 6 weeks, poor oral intake and like taking meds.  Outpatient workup showed negative GI study and patient has been seen by primary care physician and has been started on oral ciprofloxacin  without much improvement of the diarrhea.  Having poor oral intake. Patient reported feeling nauseated and generalized abdominal tenderness.  Patient reported she feels like her abdomen is raw inside.  She also endorsed abdominal cramping before which resolves with a bowel movement.  Reported poor appetite. Patient denies any vomiting, hematemesis, fever and chills. Denies any recent travel outside [place]  and known sick contact. Does not have any complaint at this time.  Further workup in the ED showed patient is hypotensive, moderate leukocytosis and evidence of AKI.  [facility] for further evaluation management.   ED Course:  At presentation to ED patient patient found hypotensive blood pressure 91/61 which has been improved. Pending GI and C. difficile panel study results. UA no evidence of UTI CBC showing leukocytosis 11.4. CMP showing elevated creatinine 1.58, elevated BUN 26. In the ED patient has been given 2 L of LR bolus.  CT abdomen pelvis no acute intra-abdominal finding.  Chronic diverticulosis without any evidence of diverticulitis.  Hospitalist has been  consulted for further management of chronic diarrhea, acute kidney injury and hypotension.   Significant labs in the ED: Lab Orders         C Difficile Quick Screen w PCR reflex         Gastrointestinal Panel by PCR , Stool         Lipase, blood         Comprehensive metabolic panel         CBC         Urinalysis, Routine w reflex microscopic -Urine, Clean Catch         CBC         Comprehensive metabolic panel         Sodium, urine, random         Creatinine, urine, random       Review of Systems:  Review of Systems  Constitutional:  Positive for malaise/fatigue. Negative for chills, fever and weight loss.  Respiratory:  Negative for cough.   Cardiovascular:  Negative for chest pain.  Gastrointestinal:  Positive for diarrhea. Negative for abdominal pain, blood in stool, constipation, heartburn, nausea and vomiting.  Genitourinary:  Negative for dysuria, frequency and urgency.  Musculoskeletal:  Negative for falls, joint pain and myalgias.  Neurological:  Positive for dizziness. Negative for headaches.  Psychiatric/Behavioral:  The patient is not nervous/anxious.     Past Medical History:  Diagnosis Date   Anxiety    Arthritis    Colon polyps    Diverticulosis    Hx of diverticulitis of colon 08/2020   Hyperlipidemia    Hypertension    Liver cyst 08/26/2015   per ultrasound  Pneumonia     Past Surgical History:  Procedure Laterality Date   BUNIONECTOMY Right    COLONOSCOPY     HEMORROIDECTOMY     POLYPECTOMY     UPPER GASTROINTESTINAL ENDOSCOPY  10/11/2010   US  ABDOMEN AND PELVIS  (ARMX HX)       reports that she quit smoking about 34 years ago. Her smoking use included cigarettes. She started smoking about 54 years ago. She has a 20 pack-year smoking history. She has never used smokeless tobacco. She reports current alcohol  use of about 14.0 standard drinks of alcohol  per week. She reports that she does not use drugs.  Allergies  Allergen Reactions    Dexilant [Dexlansoprazole] Diarrhea    Family History  Problem Relation Age of Onset   Hypertension Mother    Lung cancer Father    Other Father        cardiac arrest   Alzheimer's disease Sister    Colon cancer Neg Hx    Colon polyps Neg Hx    Esophageal cancer Neg Hx    Rectal cancer Neg Hx    Stomach cancer Neg Hx     Prior to Admission medications   Medication Sig Start Date End Date Taking? Authorizing Provider  acetaminophen  (TYLENOL  8 HOUR ARTHRITIS PAIN) 650 MG CR tablet PRN Oral 04/05/19   [provider]  ALPRAZolam (XANAX) 0.5 MG tablet Take 0.5 mg by mouth as needed for anxiety.    [provider]  ascorbic acid (VITAMIN C) 1000 MG tablet Take 1,000 mg by mouth daily.    [provider]  aspirin 81 MG tablet Take 81 mg by mouth daily.    [provider]  cholecalciferol (VITAMIN D3) 25 MCG (1000 UNIT) tablet Take 1,000 Units by mouth daily.    [provider]  ciprofloxacin  (CIPRO ) 500 MG tablet Take 1 tablet (500 mg total) by mouth 2 (two) times daily for 5 days 11/11/23     Evolocumab  (REPATHA  SURECLICK) 140 MG/ML SOAJ Inject 140 mg into the skin every 14 (fourteen) days. 02/05/23   Ladona Heinz, MD  Magnesium 400 MG CAPS take 400 mg daily for constipation    [provider]  olmesartan -hydrochlorothiazide (BENICAR  HCT) 40-25 MG tablet TAKE 1 TABLET BY MOUTH DAILY 02/04/23   Ladona Heinz, MD     Physical Exam: Vitals:   11/22/23 2145 11/22/23 2245 11/23/23 0336 11/23/23 0503  BP: 103/61 99/61 112/71 98/80  Pulse: 70 73 73 69  Resp: 18  18 18   Temp:    97.7 F (36.5 C)  TempSrc:      SpO2: 98% 97% 95% 97%  Weight:      Height:        Physical Exam Vitals and nursing note reviewed.  Constitutional:      Appearance: She is not ill-appearing.   Eyes:     Pupils: Pupils are equal, round, and reactive to light.    Cardiovascular:     Rate and Rhythm: Normal rate and regular rhythm.  Abdominal:     General:  Abdomen is flat. Bowel sounds are normal. There is no distension.     Palpations: Abdomen is soft.     Tenderness: There is no abdominal tenderness. There is no guarding or rebound.     Hernia: No hernia is present.   Skin:    General: Skin is dry.     Capillary Refill: Capillary refill takes less than 2 seconds.   Neurological:  Mental Status: She is alert and oriented to person, place, and time.      Labs on Admission: I have personally reviewed following labs and imaging studies  CBC: Recent Labs  Lab 11/22/23 1629  WBC 11.4*  HGB 13.7  HCT 39.9  MCV 91.1  PLT 260   Basic Metabolic Panel: Recent Labs  Lab 11/22/23 1629  NA 138  K 4.1  CL 102  CO2 25  GLUCOSE 111*  BUN 26*  CREATININE 1.58*  CALCIUM 9.7   GFR: Estimated Creatinine Clearance: 30.5 mL/min (A) (by C-G formula based on SCr of 1.58 mg/dL (H)). Liver Function Tests: Recent Labs  Lab 11/22/23 1629  AST 17  ALT 12  ALKPHOS 52  BILITOT 0.6  PROT 7.0  ALBUMIN 4.1   Recent Labs  Lab 11/22/23 1629  LIPASE 84*   No results for input(s): AMMONIA in the last 168 hours. Coagulation Profile: No results for input(s): INR, PROTIME in the last 168 hours. Cardiac Enzymes: No results for input(s): CKTOTAL, CKMB, CKMBINDEX, TROPONINI, TROPONINIHS in the last 168 hours. BNP (last 3 results) No results for input(s): BNP in the last 8760 hours. HbA1C: No results for input(s): HGBA1C in the last 72 hours. CBG: No results for input(s): GLUCAP in the last 168 hours. Lipid Profile: No results for input(s): CHOL, HDL, LDLCALC, TRIG, CHOLHDL, LDLDIRECT in the last 72 hours. Thyroid  Function Tests: No results for input(s): TSH, T4TOTAL, FREET4, T3FREE, THYROIDAB in the last 72 hours. Anemia Panel: No results for input(s): VITAMINB12, FOLATE, FERRITIN, TIBC, IRON, RETICCTPCT in the last 72 hours. Urine analysis:    Component Value Date/Time    COLORURINE YELLOW 11/22/2023 1629   APPEARANCEUR CLEAR 11/22/2023 1629   LABSPEC 1.010 11/22/2023 1629   PHURINE 5.0 11/22/2023 1629   GLUCOSEU NEGATIVE 11/22/2023 1629   HGBUR NEGATIVE 11/22/2023 1629   BILIRUBINUR NEGATIVE 11/22/2023 1629   KETONESUR NEGATIVE 11/22/2023 1629   PROTEINUR NEGATIVE 11/22/2023 1629   NITRITE NEGATIVE 11/22/2023 1629   LEUKOCYTESUR NEGATIVE 11/22/2023 1629    Radiological Exams on Admission: I have personally reviewed images CT ABDOMEN PELVIS WO CONTRAST Result Date: 11/21/2023 CLINICAL DATA:  Abdominal cramping and bloating. Diarrhea for 6 weeks. Diverticulosis. EXAM: CT ABDOMEN AND PELVIS WITHOUT CONTRAST TECHNIQUE: Multidetector CT imaging of the abdomen and pelvis was performed following the standard protocol without IV contrast. RADIATION DOSE REDUCTION: This exam was performed according to the departmental dose-optimization program which includes automated exposure control, adjustment of the mA and/or kV according to patient size and/or use of iterative reconstruction technique. COMPARISON:  08/29/2020 FINDINGS: Lower chest: No acute findings. Hepatobiliary: No mass visualized on this unenhanced exam. Gallbladder is unremarkable. No evidence of biliary ductal dilatation. Pancreas: No mass or inflammatory process visualized on this unenhanced exam. Spleen:  Within normal limits in size. Adrenals/Urinary tract: A few punctate right renal calculi are again seen. No evidence of ureteral calculi or hydronephrosis. Unremarkable unopacified urinary bladder. Stomach/Bowel: No evidence of obstruction, inflammatory process, or abnormal fluid collections. Normal appendix visualized. Extensive colonic diverticulosis is seen, without signs of diverticulitis. Vascular/Lymphatic: No pathologically enlarged lymph nodes identified. No evidence of abdominal aortic aneurysm. Reproductive:  No mass or other significant abnormality. Other:  None. Musculoskeletal: No suspicious bone  lesions identified. Advanced lumbar spine degenerative changes again seen. Bilateral L5 pars defects again seen, without associated spondylolisthesis. IMPRESSION: No acute findings. Colonic diverticulosis, without radiographic evidence of diverticulitis. Tiny right renal calculi. No evidence of ureteral calculi or hydronephrosis. Electronically Signed  By: Norleen DELENA Kil M.D.   On: 11/21/2023 20:37     Assessment/Plan: Principal Problem:   AKI (acute kidney injury) (HCC) Active Problems:   Chronic diarrhea   Essential hypertension   Generalized anxiety disorder   Hyperlipidemia    Assessment and Plan: Prerenal acute kidney injury-in the setting of dehydration from diarrhea -Present emergency department complaining of ongoing diarrhea for 6 weeks outpatient workup is negative.  In the setting of diarrhea patient has poor oral intake and having lightheadedness.  Has been seen PCP 2 weeks ago being started on ciprofloxacin  without much improvement of diarrhea. - Initial presentation to ED patient found hypotensive.  CBC showing leukocytosis 11.  CMP showing elevated creatinine 1.58. -Prerenal acute kidney injury in the setting of dehydration in the context of diarrhea and poor oral intake. -UA no evidence of UTI.  Checking - In the ED patient has been received 2 L of LR bolus blood pressure has been improved. -Continue maintenance fluid NS 125 cc/h. -Monitor improvement of renal function, avoid nephrotoxic agent and avoid intoxication   Chronic diarrhea -Ongoing diarrhea for 6 weeks.  Reported generalized abdominal tenderness, crampy abdominal pain with diarrhea and nausea.  Denies any fever, chill and vomiting.  Denies any hematemesis and melena.  Outpatient workup negative for any infectious process.  Being treated with ciprofloxacin  2 weeks ago without improvement of diarrhea - CBC showing leukocytosis 11.4.  GI panel and C. difficile panel has been obtained pending results.  CT abdomen no  acute abnormality, diverticulosis without any evidence of diverticulitis. -Avoid Imodium until C. difficile rules out. -Based on these GI panel study can start treatment for diarrhea.  Hypotension Essential hypertension Hypotension in the context of diarrhea.  Holding home blood pressure regimen.  Continue to resuscitate with IV fluid.  Generalized anxiety disorder -Continue Xanax at bedtime as needed.    DVT prophylaxis:  Lovenox Code Status:  Full Code Diet: Heart healthy diet Family Communication:   Family was present at bedside, at the time of interview. Opportunity was given to ask question and all questions were answered satisfactorily.  Disposition Plan: Need to follow-up with GI and serial panel results. Consults: None indicated at this time Admission status:   Inpatient, Telemetry bed  Severity of Illness: The appropriate patient status for this patient is INPATIENT. Inpatient status is judged to be reasonable and necessary in order to provide the required intensity of service to ensure the patient's safety. The patient's presenting symptoms, physical exam findings, and initial radiographic and laboratory data in the context of their chronic comorbidities is felt to place them at high risk for further clinical deterioration. Furthermore, it is not anticipated that the patient will be medically stable for discharge from the hospital within 2 midnights of admission.   * I certify that at the point of admission it is my clinical judgment that the patient will require inpatient hospital care spanning beyond 2 midnights from the point of admission due to high intensity of service, high risk for further deterioration and high frequency of surveillance required.DEWAINE    Ryonna Cimini, MD [facility]  How to contact the [facility] Attending or Consulting provider 7A - 7P or covering provider during after hours 7P -7A, for this patient.  Check the care team in [facility] and look for a)  attending/consulting [facility] provider listed and b) the [facility] team listed Log into [email or web address] and use [facility]'s universal password to access. If you do not have the password, please contact the hospital  operator. Locate the [facility] provider you are looking for under [facility] and page to a number that you can be directly reached. If you still have difficulty reaching the provider, please page the [facility] (Director on Call) for the Hospitalists listed on amion for assistance.  11/23/2023, 5:44 AM

## 2023-11-23 NOTE — Plan of Care (Signed)
   Problem: Education: Goal: Knowledge of General Education information will improve Description Including pain rating scale, medication(s)/side effects and non-pharmacologic comfort measures Outcome: Progressing

## 2023-11-24 ENCOUNTER — Other Ambulatory Visit (HOSPITAL_COMMUNITY): Payer: Self-pay

## 2023-11-24 DIAGNOSIS — I10 Essential (primary) hypertension: Secondary | ICD-10-CM | POA: Diagnosis not present

## 2023-11-24 DIAGNOSIS — K529 Noninfective gastroenteritis and colitis, unspecified: Secondary | ICD-10-CM | POA: Diagnosis not present

## 2023-11-24 DIAGNOSIS — N179 Acute kidney failure, unspecified: Secondary | ICD-10-CM | POA: Diagnosis not present

## 2023-11-24 DIAGNOSIS — F411 Generalized anxiety disorder: Secondary | ICD-10-CM | POA: Diagnosis not present

## 2023-11-24 LAB — COMPREHENSIVE METABOLIC PANEL WITH GFR
ALT: 11 U/L (ref 0–44)
AST: 11 U/L — ABNORMAL LOW (ref 15–41)
Albumin: 2.6 g/dL — ABNORMAL LOW (ref 3.5–5.0)
Alkaline Phosphatase: 28 U/L — ABNORMAL LOW (ref 38–126)
Anion gap: 5 (ref 5–15)
BUN: 19 mg/dL (ref 8–23)
CO2: 23 mmol/L (ref 22–32)
Calcium: 8 mg/dL — ABNORMAL LOW (ref 8.9–10.3)
Chloride: 109 mmol/L (ref 98–111)
Creatinine, Ser: 1.3 mg/dL — ABNORMAL HIGH (ref 0.44–1.00)
GFR, Estimated: 43 mL/min — ABNORMAL LOW (ref >60)
Glucose, Bld: 100 mg/dL — ABNORMAL HIGH (ref 70–99)
Potassium: 3.3 mmol/L — ABNORMAL LOW (ref 3.5–5.1)
Sodium: 137 mmol/L (ref 135–145)
Total Bilirubin: 0.6 mg/dL (ref 0.0–1.2)
Total Protein: 4.9 g/dL — ABNORMAL LOW (ref 6.5–8.1)

## 2023-11-24 LAB — CBC
HCT: 31.5 % — ABNORMAL LOW (ref 36.0–46.0)
Hemoglobin: 10.6 g/dL — ABNORMAL LOW (ref 12.0–15.0)
MCH: 31.1 pg (ref 26.0–34.0)
MCHC: 33.7 g/dL (ref 30.0–36.0)
MCV: 92.4 fL (ref 80.0–100.0)
Platelets: 201 10*3/uL (ref 150–400)
RBC: 3.41 MIL/uL — ABNORMAL LOW (ref 3.87–5.11)
RDW: 13 % (ref 11.5–15.5)
WBC: 8.8 10*3/uL (ref 4.0–10.5)
nRBC: 0 % (ref 0.0–0.2)

## 2023-11-24 MED ORDER — LOPERAMIDE HCL 2 MG PO CAPS
2.0000 mg | ORAL_CAPSULE | ORAL | 0 refills | Status: DC | PRN
  Filled 2023-11-24 (×2): qty 30, 30d supply, fill #0

## 2023-11-24 MED ORDER — ONDANSETRON HCL 4 MG PO TABS
4.0000 mg | ORAL_TABLET | Freq: Four times a day (QID) | ORAL | 0 refills | Status: DC | PRN
  Filled 2023-11-24 (×2): qty 20, 5d supply, fill #0

## 2023-11-24 NOTE — Discharge Summary (Signed)
 Physician Discharge Summary   Patient: Madison [name] MRN: [id number] DOB: September 13, 1950  Admit date:     11/22/2023  Discharge date: 11/24/23  Discharge Physician: Carliss LELON Canales   PCP: Larnell Hamilton, MD   Recommendations at discharge:    Pt to be discharged home.   If you experience worsening fever, chills, chest pain, shortness of breath, or other concerning symptoms, please call your PCP or go to the emergency department immediately.  Discharge Diagnoses: Principal Problem:   AKI (acute kidney injury) (HCC) Active Problems:   Chronic diarrhea   Essential hypertension   Generalized anxiety disorder   Hyperlipidemia  Resolved Problems:   * No resolved hospital problems. *   Hospital Course:  73 y.o. female with medical history significant of essential hypertension, hyperlipidemia, arthritis, and generalized anxiety disorder presented to emergency department with complaining of ongoing diarrhea for 6 weeks, poor oral intake and like taking meds.  Outpatient workup showed negative GI study and patient has been seen by primary care physician and has been started on oral ciprofloxacin  without much improvement of the diarrhea.  Having poor oral intake.   Assessment and Plan:  Intractable diarrhea-acute on chronic - Initial concern for C. difficile or underlying infectious etiology.  C. difficile returned negative.  Was initiated on Lomotil and loperamide.  Aggressive IV fluid hydration.  Symptoms resolved, no longer having loose bowel movements.  Feeling improved, tolerating diet.  Will discontinue Lomotil.  Will give prescription for loperamide to take as directed upon discharge.  Continue probiotics.  If diarrhea should continue or return, recommend patient follow-up with GI in the outpatient setting  Acute kidney injury - Exacerbated by above.  Creatinine mildly elevated above baseline, 1.58.  Showed improvement after IV fluid hydration.  Patient having marked improved p.o. intake.   Ambulatory and eager for discharge home.  Recommend continued oral hydration  Generalized anxiety disorder - Continue home regimen.  Consultants: None Procedures performed: None Disposition: Home Diet recommendation:  Discharge Diet Orders (From admission, onward)     Start     Ordered   11/24/23 0000  Diet - low sodium heart healthy        11/24/23 0955           Regular diet  DISCHARGE MEDICATION: Allergies as of 11/24/2023       Reactions   Dexilant [dexlansoprazole] Diarrhea        Medication List     STOP taking these medications    ciprofloxacin  500 MG tablet Commonly known as: CIPRO        TAKE these medications    ALIGN PO Take 1 capsule by mouth daily.   ALPRAZolam 0.5 MG tablet Commonly known as: XANAX Take 0.25 mg by mouth at bedtime as needed for sleep.   aspirin 81 MG tablet Take 81 mg by mouth daily.   loperamide 2 MG capsule Commonly known as: IMODIUM Take 1 capsule (2 mg total) by mouth as needed for diarrhea or loose stools.   MAGNESIUM PO Take 1 tablet by mouth daily.   olmesartan -hydrochlorothiazide 40-25 MG tablet Commonly known as: BENICAR  HCT TAKE 1 TABLET BY MOUTH DAILY   ondansetron  4 MG tablet Commonly known as: ZOFRAN  Take 1 tablet (4 mg total) by mouth every 6 (six) hours as needed for nausea.   OVER THE COUNTER MEDICATION Take 1 each by mouth daily. Qunol Tumeric + Ginger   Repatha  SureClick 140 MG/ML Soaj Generic drug: Evolocumab  Inject 140 mg into the skin every 14 (fourteen)  days. What changed:  when to take this additional instructions   VITAMIN C PO Take 1 tablet by mouth every evening.   VITAMIN D-3 PO Take 1 capsule by mouth every evening.         Discharge Exam: Filed Weights   11/22/23 1624  Weight: 72.6 kg    GENERAL:  Alert, pleasant, no acute distress  HEENT:  EOMI CARDIOVASCULAR:  RRR, no murmurs appreciated RESPIRATORY:  Clear to auscultation, no wheezing, rales, or  rhonchi GASTROINTESTINAL:  Soft, nontender, nondistended EXTREMITIES:  No LE edema bilaterally NEURO:  No new focal deficits appreciated SKIN:  No rashes noted PSYCH:  Appropriate mood and affect     Condition at discharge: improving  The results of significant diagnostics from this hospitalization (including imaging, microbiology, ancillary and laboratory) are listed below for reference.   Imaging Studies: CT ABDOMEN PELVIS WO CONTRAST Result Date: 11/21/2023 CLINICAL DATA:  Abdominal cramping and bloating. Diarrhea for 6 weeks. Diverticulosis. EXAM: CT ABDOMEN AND PELVIS WITHOUT CONTRAST TECHNIQUE: Multidetector CT imaging of the abdomen and pelvis was performed following the standard protocol without IV contrast. RADIATION DOSE REDUCTION: This exam was performed according to the departmental dose-optimization program which includes automated exposure control, adjustment of the mA and/or kV according to patient size and/or use of iterative reconstruction technique. COMPARISON:  08/29/2020 FINDINGS: Lower chest: No acute findings. Hepatobiliary: No mass visualized on this unenhanced exam. Gallbladder is unremarkable. No evidence of biliary ductal dilatation. Pancreas: No mass or inflammatory process visualized on this unenhanced exam. Spleen:  Within normal limits in size. Adrenals/Urinary tract: A few punctate right renal calculi are again seen. No evidence of ureteral calculi or hydronephrosis. Unremarkable unopacified urinary bladder. Stomach/Bowel: No evidence of obstruction, inflammatory process, or abnormal fluid collections. Normal appendix visualized. Extensive colonic diverticulosis is seen, without signs of diverticulitis. Vascular/Lymphatic: No pathologically enlarged lymph nodes identified. No evidence of abdominal aortic aneurysm. Reproductive:  No mass or other significant abnormality. Other:  None. Musculoskeletal: No suspicious bone lesions identified. Advanced lumbar spine  degenerative changes again seen. Bilateral L5 pars defects again seen, without associated spondylolisthesis. IMPRESSION: No acute findings. Colonic diverticulosis, without radiographic evidence of diverticulitis. Tiny right renal calculi. No evidence of ureteral calculi or hydronephrosis. Electronically Signed   By: Norleen DELENA Kil M.D.   On: 11/21/2023 20:37    Microbiology: Results for orders placed or performed during the hospital encounter of 11/22/23  C Difficile Quick Screen w PCR reflex     Status: None   Collection Time: 11/22/23 11:01 PM   Specimen: STOOL  Result Value Ref Range Status   C Diff antigen NEGATIVE NEGATIVE Final   C Diff toxin NEGATIVE NEGATIVE Final   C Diff interpretation No C. difficile detected.  Final    Comment: Performed at [facility], [address], [place], [place] [address]  Gastrointestinal Panel by PCR , Stool     Status: None   Collection Time: 11/22/23 11:01 PM   Specimen: STOOL  Result Value Ref Range Status   Campylobacter species NOT DETECTED NOT DETECTED Final   Plesimonas shigelloides NOT DETECTED NOT DETECTED Final   Salmonella species NOT DETECTED NOT DETECTED Final   Yersinia enterocolitica NOT DETECTED NOT DETECTED Final   Vibrio species NOT DETECTED NOT DETECTED Final   Vibrio cholerae NOT DETECTED NOT DETECTED Final   Enteroaggregative E coli (EAEC) NOT DETECTED NOT DETECTED Final   Enteropathogenic E coli (EPEC) NOT DETECTED NOT DETECTED Final   Enterotoxigenic E coli (ETEC)  NOT DETECTED NOT DETECTED Final   Shiga like toxin producing E coli (STEC) NOT DETECTED NOT DETECTED Final   Shigella/Enteroinvasive E coli (EIEC) NOT DETECTED NOT DETECTED Final   Cryptosporidium NOT DETECTED NOT DETECTED Final   Cyclospora cayetanensis NOT DETECTED NOT DETECTED Final   Entamoeba histolytica NOT DETECTED NOT DETECTED Final   Giardia lamblia NOT DETECTED NOT DETECTED Final   Adenovirus F40/41 NOT DETECTED NOT DETECTED Final   Astrovirus NOT  DETECTED NOT DETECTED Final   Norovirus GI/GII NOT DETECTED NOT DETECTED Final   Rotavirus A NOT DETECTED NOT DETECTED Final   Sapovirus (I, II, IV, and V) NOT DETECTED NOT DETECTED Final    Comment: Performed at [facility], [address] [place], [place] [address]    Labs: CBC: Recent Labs  Lab 11/22/23 1629 11/24/23 0334  WBC 11.4* 8.8  HGB 13.7 10.6*  HCT 39.9 31.5*  MCV 91.1 92.4  PLT 260 201   Basic Metabolic Panel: Recent Labs  Lab 11/22/23 1629 11/24/23 0334  NA 138 137  K 4.1 3.3*  CL 102 109  CO2 25 23  GLUCOSE 111* 100*  BUN 26* 19  CREATININE 1.58* 1.30*  CALCIUM 9.7 8.0*   Liver Function Tests: Recent Labs  Lab 11/22/23 1629 11/24/23 0334  AST 17 11*  ALT 12 11  ALKPHOS 52 28*  BILITOT 0.6 0.6  PROT 7.0 4.9*  ALBUMIN 4.1 2.6*   CBG: No results for input(s): GLUCAP in the last 168 hours.  Discharge time spent: 25 minutes.  Length of inpatient stay: 1 days  Signed: Carliss LELON Canales, DO [facility] 11/24/2023

## 2023-11-24 NOTE — Plan of Care (Signed)

## 2023-11-25 ENCOUNTER — Other Ambulatory Visit (HOSPITAL_COMMUNITY): Payer: Self-pay

## 2023-11-30 NOTE — Progress Notes (Unsigned)
 [name], PA-C [address] [place], [place]  [address] Phone: [phone]   Primary Care Physician: Larnell Hamilton, MD  Primary Gastroenterologist:  [name], PA-C / Norleen Kiang, MD   Chief Complaint: ED follow-up diarrhea       HPI:   Madison [name] is a 73 y.o. female, established patient of Dr. Kiang, presents for ED follow-up of diarrhea.  Patient went to the ED 11/22/2023 for ongoing diarrhea for 6 weeks.  Also had abdominal cramping and bloating.  Found to have acute kidney injury.  She was admitted to [facility] 11/22/2023 until 11/24/2023 for chronic diarrhea and acute kidney injury.  Stool studies negative for infections.  She was started on Lomotil  and Imodium .  Aggressive IV fluid hydration.  Condition improved.  Told to follow-up with GI outpatient.  She still has gallbladder.  Current symptoms: Diarrhea has greatly decreased.  She is currently having 2 loose stools daily.  When she was in the hospital she was having 6-8 watery stools per day.  She is only taken 2 Imodium  since she left the hospital.  She is taking align probiotic daily.  Diarrhea has improved, yet not resolved.  She has had some episodes of rectal bleeding which she attributes to hemorrhoids.  Recent hemoglobin 10.6, showed mild anemia.  She denies any new recent medications or antibiotic use.  She took a road trip to [place]  1 May.  Visited her sister in the hospital 10/15/2023 and was having diarrhea at that time.  Has had some intermittent abdominal cramping with the diarrhea.  Abdominal cramping is currently improved.  She reports having constipation in the past and took magnesium in the past with benefit.  She reports having follow-up lab work through her PCP yesterday.  11/22/2023: C. difficile and GI pathogen panel negative for infections.  Mildly elevated white count 11.4.  Normal hemoglobin 13.7.  Acute kidney injury with BUN 26, creatinine 1.58, GFR 34.  Mildly elevated lipase  84.  11/21/2023 CT abdomen pelvis without contrast: No acute findings.  Colonic diverticulosis, without radiographic evidence of diverticulitis.  Tiny right renal calculi. No evidence of ureteral calculi or hydronephrosis.  11/24/2023: Normal white count 8.8.  Hemoglobin 10.6, hematocrit 31, MCV 92.  Mild low potassium 3.3, albumin 2.6.  Normal LFTs.  Improved BUN 19, creatinine 1.3, GFR 43.  03/2021 last colonoscopy by Dr. Kiang: 5 mm tubular adenoma polyp removed from ascending colon.  Pandiverticulosis.  Moderate internal hemorrhoids.  Excellent prep.  7-year repeat (due 03/2028).  11/2015 colonoscopy: No polyps.  09/2015 EGD: Mild reflux esophagitis.  Otherwise normal stomach and duodenum.  Current Outpatient Medications  Medication Sig Dispense Refill   ALPRAZolam  (XANAX ) 0.5 MG tablet Take 0.25 mg by mouth at bedtime as needed for sleep.     Ascorbic Acid (VITAMIN C PO) Take 1 tablet by mouth every evening.     aspirin  81 MG tablet Take 81 mg by mouth daily.     Cholecalciferol (VITAMIN D-3 PO) Take 1 capsule by mouth every evening.     Evolocumab  (REPATHA  SURECLICK) 140 MG/ML SOAJ Inject 140 mg into the skin every 14 (fourteen) days. (Patient taking differently: Inject 140 mg into the skin See admin instructions. Inject 1mL (140mg ) into the skin on the 15th and 30th of every month) 6 mL 3   hydrocortisone  (ANUSOL -HC) 2.5 % rectal cream Place 1 Application rectally 2 (two) times daily. 30 g 1   loperamide  (IMODIUM ) 2 MG capsule Take 1  capsule (2 mg total) by mouth as needed for diarrhea or loose stools. 30 capsule 0   Na Sulfate-K Sulfate-Mg Sulfate concentrate (SUPREP) 17.5-3.13-1.6 GM/177ML SOLN Take 1 kit (354 mLs total) by mouth once for 1 dose as directed 354 mL 0   ondansetron  (ZOFRAN ) 4 MG tablet Take 1 tablet (4 mg total) by mouth every 6 (six) hours as needed for nausea. 20 tablet 0   OVER THE COUNTER MEDICATION Take 1 each by mouth daily. Qunol Tumeric + Ginger     Probiotic  Product (ALIGN PO) Take 1 capsule by mouth daily.     MAGNESIUM PO Take 1 tablet by mouth daily. (Patient not taking: Reported on 12/02/2023)     olmesartan -hydrochlorothiazide (BENICAR  HCT) 40-25 MG tablet TAKE 1 TABLET BY MOUTH DAILY (Patient not taking: Reported on 12/02/2023) 90 tablet 3   No current facility-administered medications for this visit.    Allergies as of 12/02/2023 - Review Complete 12/02/2023  Allergen Reaction Noted   Dexilant [dexlansoprazole] Diarrhea 09/12/2015    Past Medical History:  Diagnosis Date   Anxiety    Arthritis    Colon polyps    Diverticulosis    Hx of diverticulitis of colon 08/2020   Hyperlipidemia    Hypertension    Liver cyst 08/26/2015   per ultrasound   Pneumonia     Past Surgical History:  Procedure Laterality Date   BUNIONECTOMY Right    COLONOSCOPY     HEMORROIDECTOMY     POLYPECTOMY     UPPER GASTROINTESTINAL ENDOSCOPY  10/11/2010   US  ABDOMEN AND PELVIS  (ARMX HX)      Review of Systems:    All systems reviewed and negative except where noted in HPI.    Physical Exam:  BP 102/60   Pulse 72   Ht 5' 4 (1.626 m)   Wt 156 lb 6 oz (70.9 kg)   SpO2 97%   BMI 26.84 kg/m  No LMP recorded. Patient is postmenopausal.  General: Well-nourished, well-developed in no acute distress.  Lungs: Clear to auscultation bilaterally. Non-labored. Heart: Regular rate and rhythm, no murmurs rubs or gallops.  Abdomen: Bowel sounds are normal; Abdomen is Soft; No hepatosplenomegaly, masses or hernias; very mild bilateral lower abdominal Tenderness; no upper abdominal tenderness.  No guarding or rebound tenderness. Neuro: Alert and oriented x 3.  Grossly intact.  Psych: Alert and cooperative, normal mood and affect.  Imaging Studies: CT ABDOMEN PELVIS WO CONTRAST Result Date: 11/21/2023 CLINICAL DATA:  Abdominal cramping and bloating. Diarrhea for 6 weeks. Diverticulosis. EXAM: CT ABDOMEN AND PELVIS WITHOUT CONTRAST TECHNIQUE: Multidetector  CT imaging of the abdomen and pelvis was performed following the standard protocol without IV contrast. RADIATION DOSE REDUCTION: This exam was performed according to the departmental dose-optimization program which includes automated exposure control, adjustment of the mA and/or kV according to patient size and/or use of iterative reconstruction technique. COMPARISON:  08/29/2020 FINDINGS: Lower chest: No acute findings. Hepatobiliary: No mass visualized on this unenhanced exam. Gallbladder is unremarkable. No evidence of biliary ductal dilatation. Pancreas: No mass or inflammatory process visualized on this unenhanced exam. Spleen:  Within normal limits in size. Adrenals/Urinary tract: A few punctate right renal calculi are again seen. No evidence of ureteral calculi or hydronephrosis. Unremarkable unopacified urinary bladder. Stomach/Bowel: No evidence of obstruction, inflammatory process, or abnormal fluid collections. Normal appendix visualized. Extensive colonic diverticulosis is seen, without signs of diverticulitis. Vascular/Lymphatic: No pathologically enlarged lymph nodes identified. No evidence of abdominal aortic aneurysm. Reproductive:  No mass or other significant abnormality. Other:  None. Musculoskeletal: No suspicious bone lesions identified. Advanced lumbar spine degenerative changes again seen. Bilateral L5 pars defects again seen, without associated spondylolisthesis. IMPRESSION: No acute findings. Colonic diverticulosis, without radiographic evidence of diverticulitis. Tiny right renal calculi. No evidence of ureteral calculi or hydronephrosis. Electronically Signed   By: Norleen DELENA Kil M.D.   On: 11/21/2023 20:37    Labs: CBC    Component Value Date/Time   WBC 7.4 12/02/2023 1456   RBC 4.04 12/02/2023 1456   HGB 12.6 12/02/2023 1456   HCT 36.6 12/02/2023 1456   PLT 307.0 12/02/2023 1456   MCV 90.6 12/02/2023 1456   MCH 31.1 11/24/2023 0334   MCHC 34.3 12/02/2023 1456   RDW 13.4  12/02/2023 1456   LYMPHSABS 2.3 12/02/2023 1456   MONOABS 0.6 12/02/2023 1456   EOSABS 0.1 12/02/2023 1456   BASOSABS 0.1 12/02/2023 1456    CMP     Component Value Date/Time   NA 140 12/02/2023 1456   K 3.8 12/02/2023 1456   CL 104 12/02/2023 1456   CO2 27 12/02/2023 1456   GLUCOSE 104 (H) 12/02/2023 1456   BUN 18 12/02/2023 1456   CREATININE 1.24 (H) 12/02/2023 1456   CALCIUM 9.5 12/02/2023 1456   PROT 4.9 (L) 11/24/2023 0334   ALBUMIN 2.6 (L) 11/24/2023 0334   AST 11 (L) 11/24/2023 0334   ALT 11 11/24/2023 0334   ALKPHOS 28 (L) 11/24/2023 0334   BILITOT 0.6 11/24/2023 0334   GFRNONAA 43 (L) 11/24/2023 0334       Assessment and Plan:   Madison [name] is a 73 y.o. y/o female returns for hospital follow-up of:  1.  Diarrhea, uncertain etiology.  Recent C. difficile and GI pathogen panel stool studies were negative for infections.  I am suspicious for postinfectious IBS.  Symptoms are improving, yet not resolved. - Labs: Celiac - Repeat colonoscopy, check for microscopic colitis - Start OTC Benefiber powder 1 tablespoon in a drink once daily. - Continue align probiotic 1 capsule once daily for 1 more month.  2.  Rectal bleeding - Scheduling Colonoscopy I discussed risks of colonoscopy with patient to include risk of bleeding, colon perforation, and risk of sedation.  Patient expressed understanding and agrees to proceed with colonoscopy.   3.  Anemia - Labs: CBC, iron panel, ferritin, B12, folate, and celiac lab.   4.  Hypokalemia - Lab:  BMP  5.  Acute kidney injury secondary to diarrhea and dehydration - Repeat BMP - Continue drinking 64 ounces of fluids daily with electrolytes  6.  Hemorrhoids - Rx Hydrocortisone  Cream 2.5% cream apply twice daily for up to 2 weeks.  [name], PA-C  Follow up 4 weeks after colonoscopy with TG.  Also follow-up based on lab results and GI symptoms.

## 2023-12-01 DIAGNOSIS — I10 Essential (primary) hypertension: Secondary | ICD-10-CM | POA: Diagnosis not present

## 2023-12-01 DIAGNOSIS — K5909 Other constipation: Secondary | ICD-10-CM | POA: Diagnosis not present

## 2023-12-01 DIAGNOSIS — R197 Diarrhea, unspecified: Secondary | ICD-10-CM | POA: Diagnosis not present

## 2023-12-01 DIAGNOSIS — N179 Acute kidney failure, unspecified: Secondary | ICD-10-CM | POA: Diagnosis not present

## 2023-12-01 DIAGNOSIS — N200 Calculus of kidney: Secondary | ICD-10-CM | POA: Diagnosis not present

## 2023-12-01 DIAGNOSIS — K5790 Diverticulosis of intestine, part unspecified, without perforation or abscess without bleeding: Secondary | ICD-10-CM | POA: Diagnosis not present

## 2023-12-02 ENCOUNTER — Encounter: Payer: Self-pay | Admitting: Physician Assistant

## 2023-12-02 ENCOUNTER — Other Ambulatory Visit (INDEPENDENT_AMBULATORY_CARE_PROVIDER_SITE_OTHER)

## 2023-12-02 ENCOUNTER — Other Ambulatory Visit (HOSPITAL_COMMUNITY): Payer: Self-pay

## 2023-12-02 ENCOUNTER — Ambulatory Visit (INDEPENDENT_AMBULATORY_CARE_PROVIDER_SITE_OTHER): Admitting: Physician Assistant

## 2023-12-02 VITALS — BP 102/60 | HR 72 | Ht 64.0 in | Wt 156.4 lb

## 2023-12-02 DIAGNOSIS — D649 Anemia, unspecified: Secondary | ICD-10-CM | POA: Diagnosis not present

## 2023-12-02 DIAGNOSIS — K649 Unspecified hemorrhoids: Secondary | ICD-10-CM | POA: Diagnosis not present

## 2023-12-02 DIAGNOSIS — N179 Acute kidney failure, unspecified: Secondary | ICD-10-CM | POA: Diagnosis not present

## 2023-12-02 DIAGNOSIS — E876 Hypokalemia: Secondary | ICD-10-CM

## 2023-12-02 DIAGNOSIS — K625 Hemorrhage of anus and rectum: Secondary | ICD-10-CM | POA: Diagnosis not present

## 2023-12-02 DIAGNOSIS — R197 Diarrhea, unspecified: Secondary | ICD-10-CM | POA: Diagnosis not present

## 2023-12-02 LAB — BASIC METABOLIC PANEL WITH GFR
BUN: 18 mg/dL (ref 6–23)
CO2: 27 meq/L (ref 19–32)
Calcium: 9.5 mg/dL (ref 8.4–10.5)
Chloride: 104 meq/L (ref 96–112)
Creatinine, Ser: 1.24 mg/dL — ABNORMAL HIGH (ref 0.40–1.20)
GFR: 42.73 mL/min — ABNORMAL LOW (ref >60.00)
Glucose, Bld: 104 mg/dL — ABNORMAL HIGH (ref 70–99)
Potassium: 3.8 meq/L (ref 3.5–5.1)
Sodium: 140 meq/L (ref 135–145)

## 2023-12-02 LAB — CBC WITH DIFFERENTIAL/PLATELET
Basophils Absolute: 0.1 K/uL (ref 0.0–0.1)
Basophils Relative: 0.8 % (ref 0.0–3.0)
Eosinophils Absolute: 0.1 K/uL (ref 0.0–0.7)
Eosinophils Relative: 1.3 % (ref 0.0–5.0)
HCT: 36.6 % (ref 36.0–46.0)
Hemoglobin: 12.6 g/dL (ref 12.0–15.0)
Lymphocytes Relative: 30.8 % (ref 12.0–46.0)
Lymphs Abs: 2.3 K/uL (ref 0.7–4.0)
MCHC: 34.3 g/dL (ref 30.0–36.0)
MCV: 90.6 fl (ref 78.0–100.0)
Monocytes Absolute: 0.6 K/uL (ref 0.1–1.0)
Monocytes Relative: 7.8 % (ref 3.0–12.0)
Neutro Abs: 4.4 K/uL (ref 1.4–7.7)
Neutrophils Relative %: 59.3 % (ref 43.0–77.0)
Platelets: 307 K/uL (ref 150.0–400.0)
RBC: 4.04 Mil/uL (ref 3.87–5.11)
RDW: 13.4 % (ref 11.5–15.5)
WBC: 7.4 K/uL (ref 4.0–10.5)

## 2023-12-02 LAB — B12 AND FOLATE PANEL
Folate: 12.1 ng/mL (ref >5.9)
Vitamin B-12: 586 pg/mL (ref 211–911)

## 2023-12-02 MED ORDER — NA SULFATE-K SULFATE-MG SULF 17.5-3.13-1.6 GM/177ML PO SOLN
1.0000 | Freq: Once | ORAL | 0 refills | Status: AC
  Filled 2023-12-02: qty 354, 1d supply, fill #0

## 2023-12-02 MED ORDER — HYDROCORTISONE (PERIANAL) 2.5 % EX CREA
1.0000 | TOPICAL_CREAM | Freq: Two times a day (BID) | CUTANEOUS | 1 refills | Status: AC
  Filled 2023-12-02: qty 30, 10d supply, fill #0
  Filled 2024-05-21: qty 30, 10d supply, fill #1

## 2023-12-02 NOTE — Patient Instructions (Addendum)
 Your provider has requested that you go to the basement level for lab work before leaving today. Press B on the elevator. The lab is located at the first door on the left as you exit the elevator.  We have sent the following medications to your pharmacy for you to pick up at your convenience: Hydrocortisone  Cream 2.5% twice daily    Start OTC Benefiber Powder. Mix 1 - 2 Tablespoons in 6 - 8 ounces of a Drink Once Daily. Drink 64 ounces of water / fluids Daily.   You have been scheduled for a Colonoscopy. Please follow written instructions given to you at your visit today.   If you use inhalers (even only as needed), please bring them with you on the day of your procedure.  DO NOT TAKE 7 DAYS PRIOR TO TEST- Trulicity (dulaglutide) Ozempic, Wegovy (semaglutide) Mounjaro (tirzepatide) Bydureon Bcise (exanatide extended release)  DO NOT TAKE 1 DAY PRIOR TO YOUR TEST Rybelsus (semaglutide) Adlyxin (lixisenatide) Victoza (liraglutide) Byetta (exanatide) ___________________________________________________________________________  Please follow up sooner if symptoms increase or worsen   Due to recent changes in healthcare laws, you may see the results of your imaging and laboratory studies on MyChart before your provider has had a chance to review them.  We understand that in some cases there may be results that are confusing or concerning to you. Not all laboratory results come back in the same time frame and the provider may be waiting for multiple results in order to interpret others.  Please give us  48 hours in order for your provider to thoroughly review all the results before contacting the office for clarification of your results.   Thank you for trusting me with your gastrointestinal care!   [name], PA-C _______________________________________________________  If your blood pressure at your visit was 140/90 or greater, please contact your primary care physician to follow  up on this.  _______________________________________________________  If you are age 43 or older, your body mass index should be between 23-30. Your Body mass index is 26.84 kg/m. If this is out of the aforementioned range listed, please consider follow up with your Primary Care Provider.  If you are age 4 or younger, your body mass index should be between 19-25. Your Body mass index is 26.84 kg/m. If this is out of the aformentioned range listed, please consider follow up with your Primary Care Provider.   ________________________________________________________  The [facility] providers would like to encourage you to use MYCHART to communicate with providers for non-urgent requests or questions.  Due to long hold times on the telephone, sending your provider a message by [facility] may be a faster and more efficient way to get a response.  Please allow 48 business hours for a response.  Please remember that this is for non-urgent requests.  _______________________________________________________

## 2023-12-02 NOTE — Progress Notes (Signed)
 Noted

## 2023-12-03 LAB — IRON,TIBC AND FERRITIN PANEL
%SAT: 33 % (ref 16–45)
Ferritin: 114 ng/mL (ref 16–288)
Iron: 98 ug/dL (ref 45–160)
TIBC: 296 ug/dL (ref 250–450)

## 2023-12-04 ENCOUNTER — Ambulatory Visit: Payer: Self-pay | Admitting: Physician Assistant

## 2023-12-04 LAB — CELIAC DISEASE AB SCREEN W/RFX
Antigliadin Abs, IgA: 2 U (ref 0–19)
IgA/Immunoglobulin A, Serum: 128 mg/dL (ref 64–422)
Transglutaminase IgA: <2 U/mL (ref 0–3)

## 2023-12-08 ENCOUNTER — Other Ambulatory Visit: Payer: Self-pay | Admitting: Internal Medicine

## 2023-12-08 ENCOUNTER — Encounter: Payer: Self-pay | Admitting: Internal Medicine

## 2023-12-08 ENCOUNTER — Ambulatory Visit (AMBULATORY_SURGERY_CENTER): Admitting: Internal Medicine

## 2023-12-08 VITALS — BP 131/65 | HR 60 | Temp 98.1°F | Resp 17 | Ht 64.0 in | Wt 156.0 lb

## 2023-12-08 DIAGNOSIS — I10 Essential (primary) hypertension: Secondary | ICD-10-CM | POA: Diagnosis not present

## 2023-12-08 DIAGNOSIS — R197 Diarrhea, unspecified: Secondary | ICD-10-CM

## 2023-12-08 DIAGNOSIS — D649 Anemia, unspecified: Secondary | ICD-10-CM

## 2023-12-08 DIAGNOSIS — K529 Noninfective gastroenteritis and colitis, unspecified: Secondary | ICD-10-CM | POA: Diagnosis not present

## 2023-12-08 DIAGNOSIS — K625 Hemorrhage of anus and rectum: Secondary | ICD-10-CM | POA: Diagnosis not present

## 2023-12-08 DIAGNOSIS — K633 Ulcer of intestine: Secondary | ICD-10-CM

## 2023-12-08 DIAGNOSIS — K5730 Diverticulosis of large intestine without perforation or abscess without bleeding: Secondary | ICD-10-CM

## 2023-12-08 DIAGNOSIS — K5190 Ulcerative colitis, unspecified, without complications: Secondary | ICD-10-CM | POA: Diagnosis not present

## 2023-12-08 DIAGNOSIS — F419 Anxiety disorder, unspecified: Secondary | ICD-10-CM | POA: Diagnosis not present

## 2023-12-08 DIAGNOSIS — K648 Other hemorrhoids: Secondary | ICD-10-CM

## 2023-12-08 MED ORDER — SODIUM CHLORIDE 0.9 % IV SOLN: 500.0000 mL | INTRAVENOUS | Status: DC

## 2023-12-08 NOTE — Progress Notes (Signed)
 To pacu, VSS. Report to [email or web address]

## 2023-12-08 NOTE — Op Note (Signed)
 [facility] Patient Name: Madison [name] Procedure Date: 12/08/2023 9:32 AM MRN: [id number] Endoscopist: Norleen SAILOR. Abran , MD, [id number] Age: 73 Referring MD:  Date of Birth: Oct 09, 1950 Gender: Female Account #: [id number] Procedure:                Colonoscopy with biopsies Indications:              Clinically significant diarrhea of unexplained                            origin. Patient hospitalized. Seen in the office by                            the GI PA December 02, 2023. Slowly improving. She is                            concerned her blood pressure medication may be                            contributing to her diarrhea. She also has a                            history of adenomatous polyps. Previous                            colonoscopies most recently in 2017 and 2022 Medicines:                Monitored Anesthesia Care Procedure:                Pre-Anesthesia Assessment:                           - Prior to the procedure, a History and Physical                            was performed, and patient medications and                            allergies were reviewed. The patient's tolerance of                            previous anesthesia was also reviewed. The risks                            and benefits of the procedure and the sedation                            options and risks were discussed with the patient.                            All questions were answered, and informed consent                            was obtained. Prior Anticoagulants: The patient has  taken no anticoagulant or antiplatelet agents. ASA                            Grade Assessment: II - A patient with mild systemic                            disease. After reviewing the risks and benefits,                            the patient was deemed in satisfactory condition to                            undergo the procedure.                           After obtaining  informed consent, the colonoscope                            was passed under direct vision. Throughout the                            procedure, the patient's blood pressure, pulse, and                            oxygen saturations were monitored continuously. The                            Olympus Scope [id number] was introduced through the                            anus and advanced to the the cecum, identified by                            appendiceal orifice and ileocecal valve. The                            ileocecal valve, appendiceal orifice, and rectum                            were photographed. The quality of the bowel                            preparation was excellent. The colonoscopy was                            performed without difficulty. The patient tolerated                            the procedure well. The bowel preparation used was                            SUPREP via split dose instruction. Scope In: 9:49:53 AM Scope Out: 10:05:33 AM Scope Withdrawal Time: 0 hours 11 minutes 40 seconds  Total Procedure Duration:  0 hours 15 minutes 40 seconds  Findings:                 Multiple diverticula were found in the entire colon.                           There was focal superficial small area of                            ulceration in the cecum as well as superficial                            linear ulceration 1 isolated area of the ascending                            colon. Nonspecific appearing. Biopsies were taken                            with a cold forceps in the ascending colon and in                            the cecum for histology.                           Internal hemorrhoids were found during retroflexion.                           The exam was otherwise without abnormality on                            direct and retroflexion views. In addition, random                            colon biopsies of normal-appearing mucosa in the                             left colon were taken to rule out microscopic                            colitis. Complications:            No immediate complications. Estimated blood loss:                            None. Estimated Blood Loss:     Estimated blood loss: none. Impression:               - Diverticulosis in the entire examined colon.                           - Internal hemorrhoids. Superficial areas of mild                            ulceration in the cecum and right colon. Biopsied                           -  The examination was otherwise normal on direct                            and retroflexion views. Random colon biopsies of                            left colon to rule out microscopic colitis.                           - Biopsies were taken with a cold forceps for                            histology in the ascending colon and in the cecum. Recommendation:           - Repeat colonoscopy is not recommended for                            surveillance.                           - Patient has a contact number available for                            emergencies. The signs and symptoms of potential                            delayed complications were discussed with the                            patient. Return to normal activities tomorrow.                            Written discharge instructions were provided to the                            patient.                           - Resume previous diet.                           - Continue present medications.                           - Await pathology results. Norleen SAILOR. Abran, MD 12/08/2023 10:22:27 AM This report has been signed electronically.

## 2023-12-08 NOTE — Progress Notes (Signed)
 Expand All Collapse All       [name], PA-C [address] [place], [place]  [address] Phone: [phone]     Primary Care Physician: Larnell Hamilton, MD   Primary Gastroenterologist:  [name], PA-C / Norleen Kiang, MD    Chief Complaint: ED follow-up diarrhea        HPI:   Madison [name] is a 73 y.o. female, established patient of Dr. Kiang, presents for ED follow-up of diarrhea.  Patient went to the ED 11/22/2023 for ongoing diarrhea for 6 weeks.  Also had abdominal cramping and bloating.  Found to have acute kidney injury.   She was admitted to [facility] 11/22/2023 until 11/24/2023 for chronic diarrhea and acute kidney injury.  Stool studies negative for infections.  She was started on Lomotil  and Imodium .  Aggressive IV fluid hydration.  Condition improved.  Told to follow-up with GI outpatient.  She still has gallbladder.   Current symptoms: Diarrhea has greatly decreased.  She is currently having 2 loose stools daily.  When she was in the hospital she was having 6-8 watery stools per day.  She is only taken 2 Imodium  since she left the hospital.  She is taking align probiotic daily.  Diarrhea has improved, yet not resolved.  She has had some episodes of rectal bleeding which she attributes to hemorrhoids.  Recent hemoglobin 10.6, showed mild anemia.   She denies any new recent medications or antibiotic use.  She took a road trip to [place]  1 May.  Visited her sister in the hospital 10/15/2023 and was having diarrhea at that time.  Has had some intermittent abdominal cramping with the diarrhea.  Abdominal cramping is currently improved.  She reports having constipation in the past and took magnesium in the past with benefit.  She reports having follow-up lab work through her PCP yesterday.   11/22/2023: C. difficile and GI pathogen panel negative for infections.  Mildly elevated white count 11.4.  Normal hemoglobin 13.7.  Acute kidney injury with BUN 26, creatinine 1.58, GFR 34.   Mildly elevated lipase 84.   11/21/2023 CT abdomen pelvis without contrast: No acute findings.  Colonic diverticulosis, without radiographic evidence of diverticulitis.  Tiny right renal calculi. No evidence of ureteral calculi or hydronephrosis.   11/24/2023: Normal white count 8.8.  Hemoglobin 10.6, hematocrit 31, MCV 92.  Mild low potassium 3.3, albumin 2.6.  Normal LFTs.  Improved BUN 19, creatinine 1.3, GFR 43.   03/2021 last colonoscopy by Dr. Kiang: 5 mm tubular adenoma polyp removed from ascending colon.  Pandiverticulosis.  Moderate internal hemorrhoids.  Excellent prep.  7-year repeat (due 03/2028).   11/2015 colonoscopy: No polyps.   09/2015 EGD: Mild reflux esophagitis.  Otherwise normal stomach and duodenum.         Current Outpatient Medications  Medication Sig Dispense Refill   ALPRAZolam  (XANAX ) 0.5 MG tablet Take 0.25 mg by mouth at bedtime as needed for sleep.       Ascorbic Acid (VITAMIN C PO) Take 1 tablet by mouth every evening.       aspirin  81 MG tablet Take 81 mg by mouth daily.       Cholecalciferol (VITAMIN D-3 PO) Take 1 capsule by mouth every evening.       Evolocumab  (REPATHA  SURECLICK) 140 MG/ML SOAJ Inject 140 mg into the skin every 14 (fourteen) days. (Patient taking differently: Inject 140 mg into the skin See admin instructions. Inject 1mL (140mg ) into the skin on the 15th and  30th of every month) 6 mL 3   hydrocortisone  (ANUSOL -HC) 2.5 % rectal cream Place 1 Application rectally 2 (two) times daily. 30 g 1   loperamide  (IMODIUM ) 2 MG capsule Take 1 capsule (2 mg total) by mouth as needed for diarrhea or loose stools. 30 capsule 0   Na Sulfate-K Sulfate-Mg Sulfate concentrate (SUPREP) 17.5-3.13-1.6 GM/177ML SOLN Take 1 kit (354 mLs total) by mouth once for 1 dose as directed 354 mL 0   ondansetron  (ZOFRAN ) 4 MG tablet Take 1 tablet (4 mg total) by mouth every 6 (six) hours as needed for nausea. 20 tablet 0   OVER THE COUNTER MEDICATION Take 1 each by mouth daily.  Qunol Tumeric + Ginger       Probiotic Product (ALIGN PO) Take 1 capsule by mouth daily.       MAGNESIUM PO Take 1 tablet by mouth daily. (Patient not taking: Reported on 12/02/2023)       olmesartan -hydrochlorothiazide (BENICAR  HCT) 40-25 MG tablet TAKE 1 TABLET BY MOUTH DAILY (Patient not taking: Reported on 12/02/2023) 90 tablet 3      No current facility-administered medications for this visit.             Allergies as of 12/02/2023 - Review Complete 12/02/2023  Allergen Reaction Noted   Dexilant [dexlansoprazole] Diarrhea 09/12/2015          Past Medical History:  Diagnosis Date   Anxiety     Arthritis     Colon polyps     Diverticulosis     Hx of diverticulitis of colon 08/2020   Hyperlipidemia     Hypertension     Liver cyst 08/26/2015    per ultrasound   Pneumonia                 Past Surgical History:  Procedure Laterality Date   BUNIONECTOMY Right     COLONOSCOPY       HEMORROIDECTOMY       POLYPECTOMY       UPPER GASTROINTESTINAL ENDOSCOPY   10/11/2010   US  ABDOMEN AND PELVIS  (ARMX HX)              Review of Systems:    All systems reviewed and negative except where noted in HPI.      Physical Exam:  BP 102/60   Pulse 72   Ht 5' 4 (1.626 m)   Wt 156 lb 6 oz (70.9 kg)   SpO2 97%   BMI 26.84 kg/m  No LMP recorded. Patient is postmenopausal.   General: Well-nourished, well-developed in no acute distress.  Lungs: Clear to auscultation bilaterally. Non-labored. Heart: Regular rate and rhythm, no murmurs rubs or gallops.  Abdomen: Bowel sounds are normal; Abdomen is Soft; No hepatosplenomegaly, masses or hernias; very mild bilateral lower abdominal Tenderness; no upper abdominal tenderness.  No guarding or rebound tenderness. Neuro: Alert and oriented x 3.  Grossly intact.  Psych: Alert and cooperative, normal mood and affect.   Imaging Studies:  Imaging Results  CT ABDOMEN PELVIS WO CONTRAST Result Date: 11/21/2023 CLINICAL DATA:  Abdominal  cramping and bloating. Diarrhea for 6 weeks. Diverticulosis. EXAM: CT ABDOMEN AND PELVIS WITHOUT CONTRAST TECHNIQUE: Multidetector CT imaging of the abdomen and pelvis was performed following the standard protocol without IV contrast. RADIATION DOSE REDUCTION: This exam was performed according to the departmental dose-optimization program which includes automated exposure control, adjustment of the mA and/or kV according to patient size and/or use of iterative reconstruction technique. COMPARISON:  08/29/2020 FINDINGS: Lower chest: No acute findings. Hepatobiliary: No mass visualized on this unenhanced exam. Gallbladder is unremarkable. No evidence of biliary ductal dilatation. Pancreas: No mass or inflammatory process visualized on this unenhanced exam. Spleen:  Within normal limits in size. Adrenals/Urinary tract: A few punctate right renal calculi are again seen. No evidence of ureteral calculi or hydronephrosis. Unremarkable unopacified urinary bladder. Stomach/Bowel: No evidence of obstruction, inflammatory process, or abnormal fluid collections. Normal appendix visualized. Extensive colonic diverticulosis is seen, without signs of diverticulitis. Vascular/Lymphatic: No pathologically enlarged lymph nodes identified. No evidence of abdominal aortic aneurysm. Reproductive:  No mass or other significant abnormality. Other:  None. Musculoskeletal: No suspicious bone lesions identified. Advanced lumbar spine degenerative changes again seen. Bilateral L5 pars defects again seen, without associated spondylolisthesis. IMPRESSION: No acute findings. Colonic diverticulosis, without radiographic evidence of diverticulitis. Tiny right renal calculi. No evidence of ureteral calculi or hydronephrosis. Electronically Signed   By: Norleen DELENA Kil M.D.   On: 11/21/2023 20:37       Labs: CBC Labs (Brief)          Component Value Date/Time    WBC 7.4 12/02/2023 1456    RBC 4.04 12/02/2023 1456    HGB 12.6 12/02/2023 1456     HCT 36.6 12/02/2023 1456    PLT 307.0 12/02/2023 1456    MCV 90.6 12/02/2023 1456    MCH 31.1 11/24/2023 0334    MCHC 34.3 12/02/2023 1456    RDW 13.4 12/02/2023 1456    LYMPHSABS 2.3 12/02/2023 1456    MONOABS 0.6 12/02/2023 1456    EOSABS 0.1 12/02/2023 1456    BASOSABS 0.1 12/02/2023 1456        CMP     Labs (Brief)          Component Value Date/Time    NA 140 12/02/2023 1456    K 3.8 12/02/2023 1456    CL 104 12/02/2023 1456    CO2 27 12/02/2023 1456    GLUCOSE 104 (H) 12/02/2023 1456    BUN 18 12/02/2023 1456    CREATININE 1.24 (H) 12/02/2023 1456    CALCIUM 9.5 12/02/2023 1456    PROT 4.9 (L) 11/24/2023 0334    ALBUMIN 2.6 (L) 11/24/2023 0334    AST 11 (L) 11/24/2023 0334    ALT 11 11/24/2023 0334    ALKPHOS 28 (L) 11/24/2023 0334    BILITOT 0.6 11/24/2023 0334    GFRNONAA 43 (L) 11/24/2023 0334              Assessment and Plan:    Madison [name] is a 73 y.o. y/o female returns for hospital follow-up of:   1.  Diarrhea, uncertain etiology.  Recent C. difficile and GI pathogen panel stool studies were negative for infections.  I am suspicious for postinfectious IBS.  Symptoms are improving, yet not resolved. - Labs: Celiac - Repeat colonoscopy, check for microscopic colitis - Start OTC Benefiber powder 1 tablespoon in a drink once daily. - Continue align probiotic 1 capsule once daily for 1 more month.   2.  Rectal bleeding - Scheduling Colonoscopy I discussed risks of colonoscopy with patient to include risk of bleeding, colon perforation, and risk of sedation.  Patient expressed understanding and agrees to proceed with colonoscopy.    3.  Anemia - Labs: CBC, iron panel, ferritin, B12, folate, and celiac lab.    4.  Hypokalemia - Lab:  BMP   5.  Acute kidney injury secondary to diarrhea and dehydration - Repeat  BMP - Continue drinking 64 ounces of fluids daily with electrolytes   6.  Hemorrhoids - Rx Hydrocortisone  Cream 2.5% cream apply twice  daily for up to 2 weeks.   [name], PA-C   Follow up 4 weeks after colonoscopy with TG.  Also follow-up based on lab results and GI symptoms.

## 2023-12-08 NOTE — Patient Instructions (Signed)

## 2023-12-08 NOTE — Progress Notes (Signed)
 Called to room to assist during endoscopic procedure.  Patient ID and intended procedure confirmed with present staff. Received instructions for my participation in the procedure from the performing physician.

## 2023-12-09 ENCOUNTER — Telehealth: Payer: Self-pay

## 2023-12-09 NOTE — Telephone Encounter (Signed)
  Follow up Call-     12/08/2023    8:21 AM 03/29/2021    2:28 PM  Call back number  Post procedure Call Back phone  # [phone]  Permission to leave phone message Yes Yes     Patient questions:  Do you have a fever, pain , or abdominal swelling? No. Pain Score  0 *  Have you tolerated food without any problems? Yes.    Have you been able to return to your normal activities? Yes.    Do you have any questions about your discharge instructions: Diet   No. Medications  No. Follow up visit  No.  Do you have questions or concerns about your Care? No.  Actions: * If pain score is 4 or above: No action needed, pain <4.

## 2023-12-10 ENCOUNTER — Ambulatory Visit: Payer: Self-pay | Admitting: Internal Medicine

## 2023-12-10 LAB — SURGICAL PATHOLOGY

## 2023-12-22 ENCOUNTER — Other Ambulatory Visit (HOSPITAL_COMMUNITY): Payer: Self-pay

## 2023-12-22 MED ORDER — PREDNISOLONE ACETATE 1 % OP SUSP
1.0000 [drp] | Freq: Four times a day (QID) | OPHTHALMIC | 0 refills | Status: DC
  Filled 2023-12-22: qty 5, 25d supply, fill #0

## 2023-12-29 DIAGNOSIS — H26492 Other secondary cataract, left eye: Secondary | ICD-10-CM | POA: Diagnosis not present

## 2024-01-01 DIAGNOSIS — R197 Diarrhea, unspecified: Secondary | ICD-10-CM | POA: Diagnosis not present

## 2024-01-01 DIAGNOSIS — N179 Acute kidney failure, unspecified: Secondary | ICD-10-CM | POA: Diagnosis not present

## 2024-01-01 DIAGNOSIS — I10 Essential (primary) hypertension: Secondary | ICD-10-CM | POA: Diagnosis not present

## 2024-01-19 NOTE — Progress Notes (Unsigned)
 Madison Console, PA-C [address] [place], [place]  [address] Phone: [phone]   Primary Care Physician: Larnell Hamilton, MD  Primary Gastroenterologist:  Madison Console, PA-C / Norleen Kiang, MD   Chief Complaint: Follow-up diarrhea and rectal bleeding       HPI:   Madison [name] is a 73 y.o. female returns for follow-up of diarrhea and rectal bleeding attributed to hemorrhoids.  See previous OV notes for details.  Since her last visit patient stopped taking turmeric and ginger 3 months ago.  Since then she has not had any more episodes of diarrhea.  She thinks her diarrhea may have been caused by turmeric plus ginger supplement.  Currently she is feeling a lot better with no diarrhea or other GI symptoms.  Has not had any recent rectal bleeding or hemorrhoids.   She was admitted to [facility] 11/22/2023 until 11/24/2023 for chronic diarrhea and acute kidney injury.  Stool studies negative for infections.  She was started on Lomotil  and Imodium .  Aggressive IV fluid hydration.  Condition improved.  Told to follow-up with GI outpatient.  She still has gallbladder.     11/22/2023: C. difficile and GI pathogen panel negative for infections.  Mildly elevated white count 11.4.  Normal hemoglobin 13.7.  Acute kidney injury with BUN 26, creatinine 1.58, GFR 34.  Mildly elevated lipase 84.   11/21/2023 CT abdomen pelvis without contrast: No acute findings.  Colonic diverticulosis, without radiographic evidence of diverticulitis.  Tiny right renal calculi. No evidence of ureteral calculi or hydronephrosis.   11/24/2023: Normal white count 8.8.  Hemoglobin 10.6, hematocrit 31, MCV 92.  Mild low potassium 3.3, albumin 2.6.  Normal LFTs.  Improved BUN 19, creatinine 1.3, GFR 43.   03/2021 last colonoscopy by Dr. Kiang: 5 mm tubular adenoma polyp removed from ascending colon.  Pandiverticulosis.  Moderate internal hemorrhoids.  Excellent prep.  7-year repeat (due 03/2028).   11/2015 colonoscopy: No  polyps.   09/2015 EGD: Mild reflux esophagitis.  Otherwise normal stomach and duodenum.  Current Outpatient Medications  Medication Sig Dispense Refill   ALPRAZolam  (XANAX ) 0.5 MG tablet Take 0.25 mg by mouth at bedtime as needed for sleep.     Ascorbic Acid (VITAMIN C PO) Take 1 tablet by mouth every evening.     ascorbic acid (VITAMIN C) 500 MG tablet Take 500 mg by mouth daily. (Patient taking differently: Take 1,000 mg by mouth daily.)     aspirin  81 MG tablet Take 81 mg by mouth daily.     Cholecalciferol (VITAMIN D-3 PO) Take 1 capsule by mouth every evening.     Evolocumab  (REPATHA  SURECLICK) 140 MG/ML SOAJ Inject 140 mg into the skin every 14 (fourteen) days. (Patient taking differently: Inject 140 mg into the skin See admin instructions. Inject 1mL (140mg ) into the skin on the 15th and 30th of every month) 6 mL 3   hydrocortisone  (ANUSOL -HC) 2.5 % rectal cream Place 1 Application rectally 2 (two) times daily. 30 g 1   losartan (COZAAR) 50 MG tablet Take 50 mg by mouth daily.     olmesartan -hydrochlorothiazide (BENICAR  HCT) 40-25 MG tablet TAKE 1 TABLET BY MOUTH DAILY 90 tablet 3   OVER THE COUNTER MEDICATION Take 1 each by mouth daily. Qunol Tumeric + Ginger     Probiotic Product (ALIGN PO) Take 1 capsule by mouth daily.     No current facility-administered medications for this visit.    Allergies as of 01/20/2024 - Review Complete  01/20/2024  Allergen Reaction Noted   Dexilant [dexlansoprazole] Diarrhea 09/12/2015    Past Medical History:  Diagnosis Date   Allergy    Anxiety    Arthritis    Chronic kidney disease    Colon polyps    Diverticulosis    Hx of diverticulitis of colon 08/2020   Hyperlipidemia    Hypertension    Liver cyst 08/26/2015   per ultrasound   Pneumonia     Past Surgical History:  Procedure Laterality Date   BUNIONECTOMY Right    COLONOSCOPY     HEMORROIDECTOMY     POLYPECTOMY     UPPER GASTROINTESTINAL ENDOSCOPY  10/11/2010   US  ABDOMEN  AND PELVIS  (ARMX HX)      Review of Systems:    All systems reviewed and negative except where noted in HPI.    Physical Exam:  BP 130/70   Pulse 66   Ht 5' 4 (1.626 m)   Wt 162 lb (73.5 kg)   BMI 27.81 kg/m  No LMP recorded. Patient is postmenopausal.  General: Well-nourished, well-developed in no acute distress.  Neuro: Alert and oriented x 3.  Grossly intact.  Psych: Alert and cooperative, normal mood and affect.   Imaging Studies: No results found.  Labs: CBC    Component Value Date/Time   WBC 7.4 12/02/2023 1456   RBC 4.04 12/02/2023 1456   HGB 12.6 12/02/2023 1456   HCT 36.6 12/02/2023 1456   PLT 307.0 12/02/2023 1456   MCV 90.6 12/02/2023 1456   MCH 31.1 11/24/2023 0334   MCHC 34.3 12/02/2023 1456   RDW 13.4 12/02/2023 1456   LYMPHSABS 2.3 12/02/2023 1456   MONOABS 0.6 12/02/2023 1456   EOSABS 0.1 12/02/2023 1456   BASOSABS 0.1 12/02/2023 1456    CMP     Component Value Date/Time   NA 140 12/02/2023 1456   K 3.8 12/02/2023 1456   CL 104 12/02/2023 1456   CO2 27 12/02/2023 1456   GLUCOSE 104 (H) 12/02/2023 1456   BUN 18 12/02/2023 1456   CREATININE 1.24 (H) 12/02/2023 1456   CALCIUM 9.5 12/02/2023 1456   PROT 4.9 (L) 11/24/2023 0334   ALBUMIN 2.6 (L) 11/24/2023 0334   AST 11 (L) 11/24/2023 0334   ALT 11 11/24/2023 0334   ALKPHOS 28 (L) 11/24/2023 0334   BILITOT 0.6 11/24/2023 0334   GFRNONAA 43 (L) 11/24/2023 0334       Assessment and Plan:   Madison [name] is a 74 y.o. y/o female returns for follow-up of:  1.  Diarrhea: Most likely adverse side effect of OTC herbal turmeric and ginger supplement.  Diarrhea has resolved since she stopped turmeric and ginger.  Extensive GI evaluation unrevealing. -Return if she has recurrent diarrhea in the future.   2.  History of 1 small adenomatous colon polyp removed 03/2021. - 7-year repeat colonoscopy will be due 03/2028.  3.  Bleeding internal hemorrhoids, currently resolved.  Madison Console,  PA-C  Follow up if she has any recurrent GI symptoms in the future.

## 2024-01-20 ENCOUNTER — Ambulatory Visit (INDEPENDENT_AMBULATORY_CARE_PROVIDER_SITE_OTHER): Admitting: Physician Assistant

## 2024-01-20 ENCOUNTER — Encounter: Payer: Self-pay | Admitting: Physician Assistant

## 2024-01-20 VITALS — BP 130/70 | HR 66 | Ht 64.0 in | Wt 162.0 lb

## 2024-01-20 DIAGNOSIS — Z860101 Personal history of adenomatous and serrated colon polyps: Secondary | ICD-10-CM

## 2024-01-20 DIAGNOSIS — R197 Diarrhea, unspecified: Secondary | ICD-10-CM

## 2024-01-20 DIAGNOSIS — K649 Unspecified hemorrhoids: Secondary | ICD-10-CM

## 2024-01-20 NOTE — Patient Instructions (Signed)
 Continue to Avoid Tumeric, Ginger and over the counter supplements  Please follow up sooner if symptoms increase or worsen  Due to recent changes in healthcare laws, you may see the results of your imaging and laboratory studies on MyChart before your provider has had a chance to review them.  We understand that in some cases there may be results that are confusing or concerning to you. Not all laboratory results come back in the same time frame and the provider may be waiting for multiple results in order to interpret others.  Please give us  48 hours in order for your provider to thoroughly review all the results before contacting the office for clarification of your results.   Thank you for trusting me with your gastrointestinal care!   [name], PA-C _______________________________________________________  If your blood pressure at your visit was 140/90 or greater, please contact your primary care physician to follow up on this.  _______________________________________________________  If you are age 57 or older, your body mass index should be between 23-30. Your Body mass index is 27.81 kg/m. If this is out of the aforementioned range listed, please consider follow up with your Primary Care Provider.  If you are age 41 or younger, your body mass index should be between 19-25. Your Body mass index is 27.81 kg/m. If this is out of the aformentioned range listed, please consider follow up with your Primary Care Provider.   ________________________________________________________  The [facility] providers would like to encourage you to use MYCHART to communicate with providers for non-urgent requests or questions.  Due to long hold times on the telephone, sending your provider a message by [facility] may be a faster and more efficient way to get a response.  Please allow 48 business hours for a response.  Please remember that this is for non-urgent requests.   _______________________________________________________

## 2024-01-20 NOTE — Progress Notes (Signed)
 Noted

## 2024-02-17 ENCOUNTER — Ambulatory Visit: Admitting: Internal Medicine

## 2024-02-20 ENCOUNTER — Other Ambulatory Visit: Payer: Self-pay

## 2024-02-20 ENCOUNTER — Other Ambulatory Visit: Payer: Self-pay | Admitting: Cardiology

## 2024-02-20 ENCOUNTER — Other Ambulatory Visit (HOSPITAL_COMMUNITY): Payer: Self-pay

## 2024-02-20 DIAGNOSIS — E7800 Pure hypercholesterolemia, unspecified: Secondary | ICD-10-CM

## 2024-02-20 DIAGNOSIS — R931 Abnormal findings on diagnostic imaging of heart and coronary circulation: Secondary | ICD-10-CM

## 2024-02-20 MED ORDER — REPATHA SURECLICK 140 MG/ML ~~LOC~~ SOAJ
1.0000 mL | SUBCUTANEOUS | 0 refills | Status: DC
  Filled 2024-02-20: qty 6, 84d supply, fill #0

## 2024-03-24 DIAGNOSIS — H2511 Age-related nuclear cataract, right eye: Secondary | ICD-10-CM | POA: Diagnosis not present

## 2024-03-24 DIAGNOSIS — H35372 Puckering of macula, left eye: Secondary | ICD-10-CM | POA: Diagnosis not present

## 2024-03-24 DIAGNOSIS — Z23 Encounter for immunization: Secondary | ICD-10-CM | POA: Diagnosis not present

## 2024-03-24 DIAGNOSIS — H04123 Dry eye syndrome of bilateral lacrimal glands: Secondary | ICD-10-CM | POA: Diagnosis not present

## 2024-04-16 DIAGNOSIS — H43813 Vitreous degeneration, bilateral: Secondary | ICD-10-CM | POA: Diagnosis not present

## 2024-04-16 DIAGNOSIS — H35033 Hypertensive retinopathy, bilateral: Secondary | ICD-10-CM | POA: Diagnosis not present

## 2024-04-16 DIAGNOSIS — H35372 Puckering of macula, left eye: Secondary | ICD-10-CM | POA: Diagnosis not present

## 2024-04-16 DIAGNOSIS — D3132 Benign neoplasm of left choroid: Secondary | ICD-10-CM | POA: Diagnosis not present

## 2024-04-16 DIAGNOSIS — Z961 Presence of intraocular lens: Secondary | ICD-10-CM | POA: Diagnosis not present

## 2024-05-21 ENCOUNTER — Other Ambulatory Visit (HOSPITAL_COMMUNITY): Payer: Self-pay

## 2024-06-04 ENCOUNTER — Other Ambulatory Visit: Payer: Self-pay | Admitting: Cardiology

## 2024-06-04 ENCOUNTER — Other Ambulatory Visit: Payer: Self-pay

## 2024-06-04 DIAGNOSIS — E7800 Pure hypercholesterolemia, unspecified: Secondary | ICD-10-CM

## 2024-06-04 DIAGNOSIS — R931 Abnormal findings on diagnostic imaging of heart and coronary circulation: Secondary | ICD-10-CM

## 2024-06-04 MED ORDER — REPATHA SURECLICK 140 MG/ML ~~LOC~~ SOAJ
1.0000 mL | SUBCUTANEOUS | 0 refills | Status: AC
  Filled 2024-06-04: qty 6, 84d supply, fill #0

## 2024-06-04 NOTE — Telephone Encounter (Signed)
 Pt Overdue

## 2024-06-08 ENCOUNTER — Other Ambulatory Visit (HOSPITAL_COMMUNITY): Payer: Self-pay

## 2024-06-08 MED ORDER — OFLOXACIN 0.3 % OP SOLN
1.0000 [drp] | Freq: Four times a day (QID) | OPHTHALMIC | 3 refills | Status: AC
  Filled 2024-06-08: qty 10, 50d supply, fill #0

## 2024-06-08 MED ORDER — PREDNISOLONE ACETATE 1 % OP SUSP
1.0000 [drp] | Freq: Four times a day (QID) | OPHTHALMIC | 3 refills | Status: AC
  Filled 2024-06-08: qty 10, 50d supply, fill #0

## 2024-06-09 ENCOUNTER — Other Ambulatory Visit: Payer: Self-pay

## 2024-06-09 ENCOUNTER — Other Ambulatory Visit (HOSPITAL_COMMUNITY): Payer: Self-pay

## 2024-06-09 MED ORDER — AZITHROMYCIN 250 MG PO TABS
ORAL_TABLET | ORAL | 0 refills | Status: AC
  Filled 2024-06-09: qty 6, 5d supply, fill #0

## 2024-06-09 MED ORDER — OLMESARTAN MEDOXOMIL-HCTZ 40-25 MG PO TABS
1.0000 | ORAL_TABLET | Freq: Every day | ORAL | 3 refills | Status: AC
  Filled 2024-06-09: qty 90, 90d supply, fill #0

## 2024-06-09 MED ORDER — ALPRAZOLAM 0.5 MG PO TABS
1.0000 mg | ORAL_TABLET | Freq: Every day | ORAL | 1 refills | Status: AC | PRN
  Filled 2024-06-09: qty 30, 30d supply, fill #0

## 2024-06-17 ENCOUNTER — Other Ambulatory Visit: Payer: Self-pay | Admitting: Internal Medicine

## 2024-06-17 DIAGNOSIS — Z1231 Encounter for screening mammogram for malignant neoplasm of breast: Secondary | ICD-10-CM

## 2024-06-29 ENCOUNTER — Other Ambulatory Visit (HOSPITAL_COMMUNITY): Payer: Self-pay

## 2024-07-28 ENCOUNTER — Ambulatory Visit
